# Patient Record
Sex: Female | Born: 1965 | Race: White | Hispanic: No | Marital: Single | Smoking: Current every day smoker
Health system: Southern US, Community
[De-identification: ages and names within clinical notes are randomized; demographics above are authoritative.]

## PROBLEM LIST (undated history)

## (undated) DIAGNOSIS — R51 Headache: Secondary | ICD-10-CM

## (undated) DIAGNOSIS — G809 Cerebral palsy, unspecified: Secondary | ICD-10-CM

## (undated) DIAGNOSIS — K219 Gastro-esophageal reflux disease without esophagitis: Secondary | ICD-10-CM

## (undated) DIAGNOSIS — Z8489 Family history of other specified conditions: Secondary | ICD-10-CM

## (undated) DIAGNOSIS — G709 Myoneural disorder, unspecified: Secondary | ICD-10-CM

## (undated) DIAGNOSIS — R0600 Dyspnea, unspecified: Secondary | ICD-10-CM

## (undated) DIAGNOSIS — J302 Other seasonal allergic rhinitis: Secondary | ICD-10-CM

## (undated) DIAGNOSIS — M1990 Unspecified osteoarthritis, unspecified site: Secondary | ICD-10-CM

## (undated) DIAGNOSIS — F40240 Claustrophobia: Secondary | ICD-10-CM

## (undated) HISTORY — PX: BACK SURGERY: SHX140

## (undated) HISTORY — PX: KNEE ARTHROSCOPY: SHX127

## (undated) HISTORY — PX: TUBAL LIGATION: SHX77

## (undated) HISTORY — PX: HAND SURGERY: SHX662

## (undated) HISTORY — PX: LEG SURGERY: SHX1003

## (undated) HISTORY — PX: DIAGNOSTIC LAPAROSCOPY: SUR761

---

## 1998-07-27 ENCOUNTER — Emergency Department (HOSPITAL_COMMUNITY): Admission: EM | Admit: 1998-07-27 | Discharge: 1998-07-27 | Payer: Self-pay | Admitting: Emergency Medicine

## 1998-07-27 ENCOUNTER — Encounter: Payer: Self-pay | Admitting: Emergency Medicine

## 2000-02-11 ENCOUNTER — Other Ambulatory Visit: Admission: RE | Admit: 2000-02-11 | Discharge: 2000-02-11 | Payer: Self-pay | Admitting: *Deleted

## 2000-02-11 ENCOUNTER — Other Ambulatory Visit: Admission: RE | Admit: 2000-02-11 | Discharge: 2000-02-11 | Payer: Self-pay | Admitting: Obstetrics and Gynecology

## 2000-02-24 ENCOUNTER — Encounter: Admission: RE | Admit: 2000-02-24 | Discharge: 2000-02-24 | Payer: Self-pay | Admitting: Obstetrics and Gynecology

## 2000-02-24 ENCOUNTER — Encounter: Payer: Self-pay | Admitting: Obstetrics and Gynecology

## 2000-03-09 ENCOUNTER — Ambulatory Visit (HOSPITAL_COMMUNITY): Admission: RE | Admit: 2000-03-09 | Discharge: 2000-03-09 | Payer: Self-pay | Admitting: Obstetrics and Gynecology

## 2000-03-09 ENCOUNTER — Encounter (INDEPENDENT_AMBULATORY_CARE_PROVIDER_SITE_OTHER): Payer: Self-pay

## 2000-07-12 ENCOUNTER — Encounter: Payer: Self-pay | Admitting: Emergency Medicine

## 2000-07-12 ENCOUNTER — Emergency Department (HOSPITAL_COMMUNITY): Admission: EM | Admit: 2000-07-12 | Discharge: 2000-07-12 | Payer: Self-pay | Admitting: Emergency Medicine

## 2001-02-12 ENCOUNTER — Inpatient Hospital Stay (HOSPITAL_COMMUNITY): Admission: EM | Admit: 2001-02-12 | Discharge: 2001-02-15 | Payer: Self-pay

## 2001-02-23 ENCOUNTER — Encounter: Admission: RE | Admit: 2001-02-23 | Discharge: 2001-05-24 | Payer: Self-pay | Admitting: Orthopedic Surgery

## 2007-05-02 ENCOUNTER — Inpatient Hospital Stay (HOSPITAL_COMMUNITY): Admission: RE | Admit: 2007-05-02 | Discharge: 2007-05-08 | Payer: Self-pay | Admitting: Specialist

## 2007-05-02 IMAGING — RF DG LUMBAR SPINE 2-3V
1 series · 4 of 4 positions shown · non-contrast
Comparison: none

CLINICAL DATA: L1-2 HNP. Chronic pain. Posterior fusion. 
 LUMBAR SPINE - 2 VIEW:

[Series 0: run · 4 of 4 slices shown]
[im 1/4]
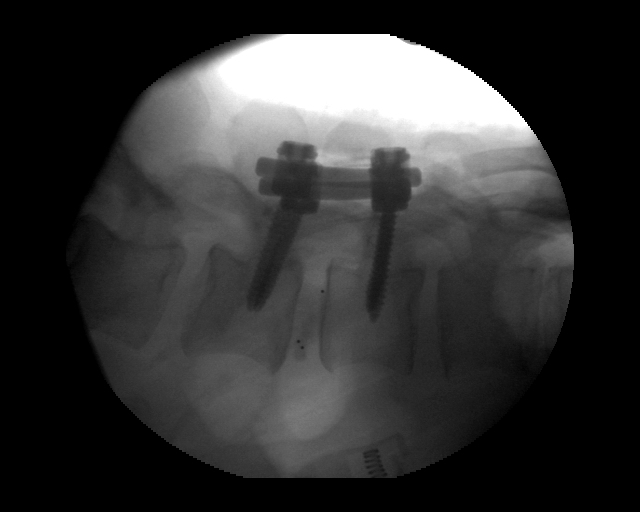
[im 2/4]
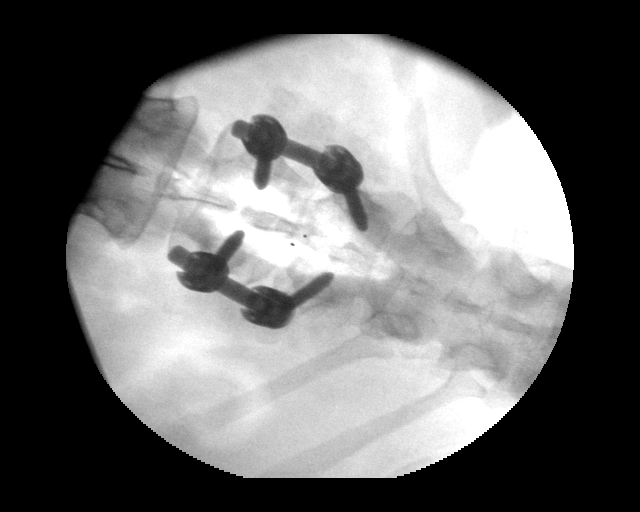
[im 3/4]
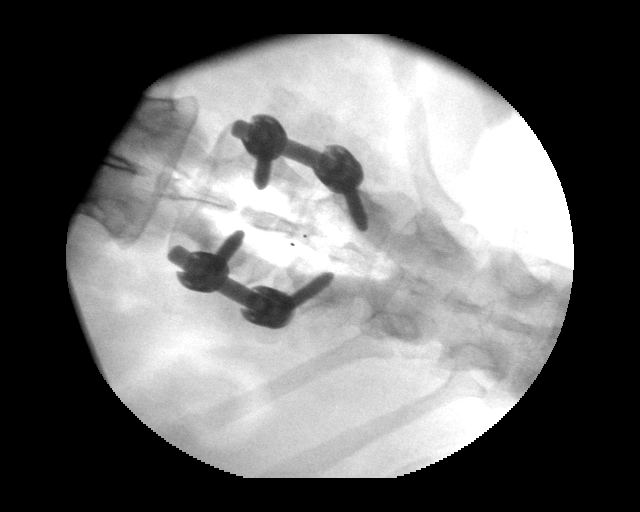
[im 4/4]
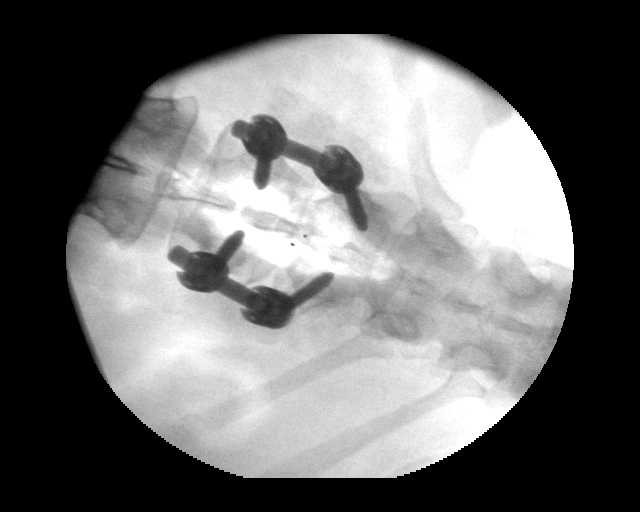

[4 of 4 positions shown; findings below may reference images not displayed]

FINDINGS: Intraoperative spot films of the thoracolumbar region are submitted postoperatively for interpretation. 
 Posterior rod and bi-pedicular screw fixation and interbody fusion material is identified at what appears to be L1-2.  Correlate clinically and with prior studies, which are not available at this time.
IMPRESSION: Posterior fusion as described ? levels appear L1-2.

## 2007-05-03 ENCOUNTER — Ambulatory Visit: Payer: Self-pay | Admitting: Physical Medicine & Rehabilitation

## 2010-12-22 NOTE — Op Note (Signed)
Tammy Li, Tammy Li               ACCOUNT NO.:  [id number]   MEDICAL RECORD NO.:  [id number]          PATIENT TYPE:  INP   LOCATION:  2871                         FACILITY:  [facility]   PHYSICIAN:  [name], M.D.   DATE OF BIRTH:  [birth year]   DATE OF PROCEDURE:  05/02/2007  DATE OF DISCHARGE:                               OPERATIVE REPORT   PREOPERATIVE DIAGNOSES:  1. Herniated nucleus pulposus, central and right-sided L1-2.  2. Long history of degenerative disk disease, L1-L2.  3. Patient with cerebral palsy and ability to ambulate independently.   POSTOPERATIVE DIAGNOSES:  1. Herniated nucleus pulposus, central and right-sided L1-2.  2. Long history of degenerative disk disease, L1-L2.  3. Patient with cerebral palsy and ability to ambulate independently.  4. Retrolisthesis, L1-2, with bilateral lateral recess stenosis as      well.   PROCEDURE:  Right microdiskectomy, L1-2, with left lateral recess  decompression, L1-2; right-sided L1-2 transforaminal lumbar interbody  fusion using a 9-mm DePuy Concorde cage with local bone graft;  posterolateral fusion, L1-2, utilizing the Vitoss and local bone graft;  left-sided L1 bone marrow aspirate; and L1-2 pedicle screw and rod  fixation using DePuy Monarch system.   SURGEON:  [name], M.D.   ASSISTANT:  Maud Deed, P.A.-C.   ANESTHESIA:  GOT, Dr. Arta Bruce, supplemented with local infiltration  with Marcaine 0.5% with 1:200,000 epinephrine, 20 mL.   FINDINGS:  Central and bilateral lateral recess stenosis, retrolisthesis  L1-2, HNP centrally, L1-2.   SPECIMENS:  None.   ESTIMATED BLOOD LOSS:  250 mL.   COMPLICATIONS:  None.   DRAINS:  Foley to straight drain, Hemovac, right upper lumbar.   DISPOSITION:  The patient returned to the PACU in good condition.   HISTORY OF PRESENT ILLNESS:  45 year old female with greater than 2-year  history of low back pain with radiation into her upper lumbar and upper  buttocks.  She has been treated conservatively with MRI suggesting  degenerative disk changes at L1-2.  Over the past 6-8 months, worsening  discomfort with increasing weakness into her proximal lower extremities.  The patient underwent repeat MRI which demonstrated disk protrusion, L1-  2, with moderate compression of the thecal sac, likely causing bilateral  L2 nerve root compression consistent with her pattern of weakness of  proximal thigh, hip flexor weakness.  She has a history of cerebral  palsy, scissors gait, and has noticed a gradual decline in ability to  ambulate due to weakness in her legs.  She underwent attempts at ESIs  that were only temporizing in terms of their relief of pain.  She is on  chronic narcotic medicines to relieve her discomfort, showing  progressive decline in function.  She is brought to the operating room  to undergo excision of HNP with decompression at L1-2 level and TLIF at  the same level, right side, as her pain is predominantly right thigh and  buttock pain.   INTRAOPERATIVE FINDINGS:  As above.   DESCRIPTION OF PROCEDURE:  After adequate general anesthesia, the  patient had a Foley catheter placed and  standard preoperative  antibiotics.  She was rolled to a prone position using the Towner  spinal table.  She had Foley catheter placed prior to rolling to the  prone position.  Standard prep with DuraPrep solution from the mid-  dorsal spine to the lower lumbar spine using DuraPrep, draped in the  usual manner.  Iodine Vi-Drape was used.   Two spinal needles placed at the expected upper lumbar segment and  intraoperative C-arm fluoroscopy used to ascertain the position of these  needles just below the L2 spinous process noted on lateral view of the  lowest needle.  Incision was made in the midline extending from this  level superiorly approximately 3-1/2 to 4 inches in length through the  skin and subcutaneous layers down to the lumbodorsal  fascia.  Bleeders  controlled using electrocautery.  Incision made on both sides of the  spinous processes of the expected L3, L2, L1 and T12.  A Cobb was used  to elevate her lumbar apparent lower dorsal muscles to the sides.  Clamps then placed on the expected spinous process of L1 and L2.  Intraoperative lateral C-arm demonstrating these at the expected L2 and  L1 levels as counted up from the L5 segment superiorly.  These areas  were then marked using electrocautery and then a marking pen.  A Cobb  was then used to carefully lift the paralumbar and paradorsal muscles  laterally, preserving the capsule at the L1, T12 level and removing the  capsule at the L1-L2 segment bilaterally.  Then exposing out further  lateral to the tips of the transverse process of L2 and at L1 at the L1-  2 level as well as at the T12-L1 level.  Bleeders controlled using  bipolar and monopolar electrocautery.  Gutters here then packed with  sponges.  A microretractor then placed after removal of the sponges.  Again then repacking lateral recesses and lateral posterolateral region.  Osteotome then used to resect the inferior articular process on the  right side of L1 and then remove bone from the inferior portion of the  lamina of L1, right side, superiorly, removing inferior portions of the  pars on the right side at L1 as well up to the insertion of the ligament  of flavum on the ventral aspect of the L1 lamina.  A 3-mm Kerrison then  used to resect the ligament of flavum.  Loupe magnification and headlamp  was used during this portion of procedure, resecting the ligament of  flavum down off of the superior aspect of the lamina of L2 and off the  medial aspect of the facet at the L1-2 level on the right side,  performing medial facetectomy of the L2 superior articular process and  then resecting the superior articular process overlying the neural  foramen on the right at the L1-2 level.  Small bleeders  controlled using  bipolar electrocautery.  The L1-2 disk identified on the right side and  carefully exposed using a Penfield 4.  Small epidural veins cauterized  using bipolar electrocautery here as well as within the inferior aspect  of the neural foramen, carefully freeing up the L1 nerve root and then  the 1-2 foramen and lifting it superiorly here.  Gelfoam placed and then  a cottonoid.  The [place] used for light retraction of the thecal sac.  A  15 blade scalpel used to incise the disk.  Pituitary rongeurs used to  then excise disk material here at the L1/2 level.  Using straight  upbiting pituitaries, disk appeared to be primarily degenerated, and the  L1 vertebra appeared to retrolisthesed on the L2 level.  This likely  represented a  pseudo disk protrusion, as healing over the posterior  aspect of disk has primarily hard disk and spur present.  This was  palpated using a hockey stick neural probe and Penfield 4.  Foraminotomy  was performed over the L2 nerve root, and as it exited out its neural  foramen it appeared to be quite well-freed.  On the left side similarly,  the inferior articular process of L1 was carefully resected over about  50% of its height from cranial caudal and then carried medial, resecting  a small portion of the inferior lamina of L1 up to the insertion of  ligament of flavum here.  The ligament of flavum was then lifted using  Cushings and then resected off the inferior aspect of the L1 lamina and  then the medial aspect of the L1-2 facet.  Foraminotomy performed down  the L2 nerve root using 2 and 3-mm Kerrisons.  Then the lateral recess  decompressed on this side, resecting ligament of flavum off the medial  aspect of the superior articular process of L2 and decompressing the  lateral recess out to the medial border of the L2 pedicle.  Hockey stick  neural probe could then easily be passed out the L2 neural foramen as  well as the L1 neural foramen, with  retraction of the thecal sac and  palpation of the disk here demonstrating primarily hard disk, again felt  to be secondary to retrolisthesis of L1-L2.  There was no soft disk  herniation material noted.  Gelfoam thrombin-soaked then placed within  the lateral recess as well as cottonoids to control any small epidural  vein bleeding here.   Attention then turned to placement of the pedicle screws on the left  side where a pedicle finder was applied to the intersection of the  transverse process of L1 with the lateral aspect of pedicle of L1 just  at the superior articular process of L1 laterally.  The awl then placed.  Intraoperative C-arm radiograph demonstrated all properly aligned to the  center portion of the expected pedicle on lateral view.  An opening into  the lateral aspect of the pedicle was made, and then the handheld  pedicle finder then used to probe the pedicle down to about 35-40 mm.  Depth of 40 mm was chosen with a 5.5 screw, and tapping was performed  using a 4.75 tap.  Note that prior to tapping, a blunt-tip trocar was  used to place aspiration equipment for the Vitoss, this amounted to a  [employer]oshiba-like needle being inserted within the pedicle and within the  vertebral body of L1.  Aspiration of 10 mL of bone marrow was carried  out without difficulty.  The aspiration device was then removed.  Tapping performed using a 4.75 tap.  Decortication carried out of the  transverse process of L1 on the left side, and then the pedicle screw  measuring 5.5 x 40 mm was then inserted at a proper degree of  orientation of convergence and of lordosis here.  This fixed the pedicle  quite nicely and obtained excellent purchase.  The fastener for the  screw was then loosened using the fastener looser.   Then attention was turned to the L2 level where similarly the awl was  used to perform an opening into the lateral aspect of the pedicle  at the  L2 pedicle intersection of the  transverse process of the L1-2 facet  laterally.  Proper degree of convergence.  C-arm fluoroscopy used to  ascertain correct position and alignment of the awl at this insertion  point, and then a pedicle probe was used probe the pedicle channel at  the correct degree of convergence.  No difficulty was obtained doing  this.  Note that a ball-tipped probe was used between each portion of  the insertion of the pedicle screws and was used after use of the  pedicle finder and was used after tapping procedure as well prior to  placement of the screw.  Note that after using the awl, then the pedicle  finder was used probe the pedicle at the L2 level on the left side and  ball-tipped probe then again used to check the channel and ensure the  patency of the opening placed within the pedicle to ensure no broach in  cortex.  Again, a 5.5 x 40-mm screw was chosen, tapping with a 4.75,  decorticating the transverse process of L2, placing Vitoss then from L1  to L2 posterolaterally here and placing the pedicle screws then at the  L2 level on the left side.  Intraoperative C-arm demonstrated the screws  at L1 and L2 to be an excellent position and alignment.   Attention then turned to the right side, returning to the right side  then again using loupe medication and headlamp, an awl was used to make  an initial opening in the L1 pedicle on the right side at the T12-L1  facet level at the intersection of the transverse process of L1 with the  lateral aspect of pedicle of L1 observed on C-arm fluoroscopy to be in  the correct degree of orientation with the pedicle.  I was in the  central portion, so the handheld pedicle finder then used to probe the  pedicle.  Ball-tipped probe used to ensure patency of the pedicle  opening with no evidence of broaching of cortex.  Tapping then performed  using a 4.75 tap, again checking the entry point and the opening within  the pedicle to ensure no broaching of cortex  again using ball-tipped  probe the entire depth.  Decortication carried out of the transverse  process of L1.  Vitoss was then applied and then after tapping with a  4.75 tap, the 5.5 screw was placed.  This was measured at 40 mm x 5.5.  It was placed without difficulty.   Attention then turned to L2 on the right side.  Awl used to make an  entry point using C-arm fluoroscopy to ascertain the correct position  and alignment and then using handheld pedicle finder, again probing the  pedicle channel and then using ball-tipped probe to check the channel to  ensure patency and no evidence of broach of cortex.  Depth of 40 mm  obtained.  A 4.75 tap used to tap the opening into the pedicle itself.  The ball-tipped probe again used to check the channel, and then a 40-mm  x 5.5 screw then placed in the proper degree of convergence and  orientation.  Decortication was carried out of the transverse process of  L2 on the right side without difficulty.  Vitoss was placed from the  transverse process of L1 to L2 on the right side.  Note that each of the  pedicle screw fasteners were then loosened using the correct looser to  allow for engagement of the  rod within the fastener.  With this then,  the patient still had muscle relaxer on board so that instead of  checking the pedicle resistance at this point, continuation of the case  was carried out with TLIF performed on the right side.  Note that the  thecal sac at the L1-2 level was retracted medially a few millimeters  and the L1 nerve retracted superiorly.  An osteotome was used to resect  the superior articular process of L2 on the right side flush with the  pedicle superiorly.  Then this was used to also make an opening into the  disk space through the previous annulotomy performed with diskectomy on  this right side.  Bleeders controlled using bipolar electrocautery.  Pituitary rongeurs used to debride the disk space.  A laminar spreader  was  then placed from the lamina of the residual portion of L1 on the  right side to that of the L2 lamina and distraction of the disk space  obtained.  Disk space further debrided using pituitary rongeurs,  upbiting and straight.  Curettage was then carried out of the  cartilaginous endplates using straight curettes as well as upbiting right, upbiting left curettes and then the ring transfer used, straight  and upbiting, both of this level.  Curettaging the endplates down to  bleeding bony endplates at the inferior aspect L1 and superior aspect  L2.  Sounding was then performed of the disk space using an 8-mm and  then a 9-mm sound, 9  mm providing excellent fit.  Note that the  dilatation was carried out of the disk space using 8 and 9 mm dilators  initially.  Following this then, irrigation was carried out.  The  permanent cage chosen was a 9-mm cage, and note that this was then  packed with local bone graft that had been harvested from removal of the  facet on the right side at the L1-2 level and partial facetectomy on the  left side.  This was passed through the bone mill.  The bone graft not  used within the Concorde cage, 9-mm, was then placed into the disk space  on the right side.  Then impacted into place using an 8-mm trial.  This  completed then, the cage was then inserted into place and packed into  place using a laminar spreader for distraction of disk space with its  insertion.  With this completed then, intraoperative C-arm demonstrated  the cage with an excellent position and alignment of the central  portions of the disk.  Testing was then carried out.  Soft tissue  resistance for the screws, left side L1 greater than 50, the left L2  greater than 40, right L1 greater than 50, and right L2 greater than 50.  These represented excellent soft tissue resistant measurements  indicating screw placement to be adequate.   Following this, irrigation was carried out.  Small bleeders  controlled  using bipolar electrocautery.  Careful inspection of the spinal canal  demonstrated the L1-L2 nerve roots exiting without impingement.  There  was no bony debris found within the spinal canal on the right side or  the posterior aspect of the laminotomy region.  40-mm rods were chosen.  These were placed within the screw fasteners on both sides and the caps  then applied.  The caps were then tightened to the fasteners, connecting  rods the fasteners at 80 foot pounds at the L1 level.  Compression  obtained then between the right-sided L1  and L2 fastener, and the  fastener at L2 was then tightened to 80 foot pounds.  Similarly, this  was performed on the left side and compression obtained between the  fasteners, and the cap and on the left side at L2 was then fastened at  80 foot pounds.  This completed internal fixation, and permanent C-arm  images obtained in the AP and lateral planes documenting the fixation of  the L1-2 level on C-arm fluoroscopy views.  Also, it demonstrated the  place of the cage in excellent position and alignment at the L1-2 level.  This completed, the soft tissues were carefully debrided of any  devascularized muscle tissue on either side of the midline.  This was  done using curved Mayo scissors.  Bleeders controlled using  electrocautery.  A medium Hemovac drain placed in the depths of the  incision on the right side and the lumbodorsal fascia then approximated  in the midline with interrupted #1 Vicryl sutures in simple fashion.  Deep subcutaneous layers approximated with interrupted #1 and 0 Vicryl  suture, the more superficial layers with interrupted 2-0 Vicryl sutures,  and then the skin closed with a running subcutaneous stitch of 4-0  Vicryl.  The patient then had Dermabond applied, 4 x 4s affixed to the  skin with Hypafix tape.   The patient was then returned to a supine position, reactivated,  extubated, and returned to the recovery room in  satisfactory condition.  All instrument and sponge counts were correct.  Note that intraoperative  studies, including neuro monitoring, demonstrated no abnormalities noted  throughout the case.  Cell Saver blood return:  None.      [name], M.D.  Electronically Signed     JEN/MEDQ  D:  05/02/2007  T:  05/02/2007  Job:  [id number]

## 2010-12-25 NOTE — Discharge Summary (Signed)
Tammy Li, Tammy Li               ACCOUNT NO.:  [id number]   MEDICAL RECORD NO.:  [id number]          PATIENT TYPE:  INP   LOCATION:  5012                         FACILITY:  [facility]   PHYSICIAN:  [name], M.D.   DATE OF BIRTH:  1965-09-30   DATE OF ADMISSION:  05/02/2007  DATE OF DISCHARGE:  05/08/2007                               DISCHARGE SUMMARY   ADMISSION DIAGNOSES:  1. Herniated nucleus pulposus central and right, L1-2.  2. Long history of degenerative disk disease, L1-2.  3. Cerebral palsy with ability to ambulate independently.  4. Anxiety and claustrophobia.  5. Gastroesophageal reflux disease.  6. Chronic constipation.   DISCHARGE DIAGNOSES:  1. Herniated nucleus pulposus central and right, L1-2.  2. Long history of degenerative disk disease, L1-2.  3. Cerebral palsy with ability to ambulate independently.  4. Retrolisthesis, L1-2, with bilateral lateral recess stenosis.  5. Posthemorrhagic anemia requiring blood transfusion.  6. Anxiety and claustrophobia.  7. Gastroesophageal reflux disease.  8. Chronic constipation.   PROCEDURE:  On May 02, 2007, the patient underwent right  microdiskectomy, L1-2, with left lateral recess decompression, L1-2,  right-sided L1-2 transforaminal lumbar interbody fusion with local bone  graft and cage.  Posterolateral fusion, L1-2, with Vitoss and local bone  graft with rod and pedicle screw fixation.  This was performed by Dr.  [name], assisted by Maud Deed, PA-C, under general anesthesia.   CONSULTATIONS:  [place] [facility], Ranelle Oyster, M.D.   BRIEF HISTORY:  The patient is a 45 year old female with a 2+ years'  history of low back pain and radiation into the upper lumbar and upper  buttock area.  MRI has suggested degenerative disk changes at L1-2.  Over the last few months she has had worsening pain and weakness in the  lower extremities.  Repeat MRI demonstrated disk protrusion at L1-2 with  moderate compression of the thecal sac, likely causing bilateral L2  nerve root compression consistent with her pattern of weakness of  proximal thighs as well as hip flexor weakness.  She does have known  history of cerebral palsy with scissor gait and has noted a gradual  decline in ability to ambulate due to the weakness in her legs.  Epidural steroid injections have temporized her relief, but she is now  on chronic pain medications to relieve her discomfort.  It was felt she  would benefit from the procedure as stated above and was admitted for  this.   BRIEF HOSPITAL COURSE:  Postoperatively neurovascular motor function of  the lower extremities was noted to be intact.  The patient was started  on ambulation and gait training by physical therapy utilizing a brace  and walker for ambulation.  She was able to advance slowly.  Initially  it was felt she may need inpatient rehabilitation.  However, she was  able to advance fairly well during the hospital stay and was able to be  discharged home instead.  She was able to walk as much as 150 feet prior  to discharge.  Occupational therapy assisted with ADLs.  She was able to  demonstrate donning and doffing of her Aspen LSO without difficulty.  The patient had mild postop ileus treated with a Dulcolax suppository.  Diet was held until her bowel function returned.  Once bowel function  returned she was progressed to a regular diet.  The patient became  symptomatic with postoperative anemia with hemoglobin and hematocrit 8.2  and 24 and was transfused with 2 units of autogenous blood.  After  transfusion hemoglobin to 10.0.  Hemovac drain was discontinued on the  first postoperative day and her wound was healing well throughout the  remainder of the stay.  At the time of discharge she was afebrile, vital  signs stable.  She was voiding without difficulty, having normal bowel  movements and was taking a regular diet.  She was discharged to  home in  stable condition.   PERTINENT LABORATORY VALUES:  Hemoglobin and hematocrit as stated above.  Chemistry studies on admission within normal limits.  Urinalysis on  admission:  Trace leukocyte esterase, 0-2 wbc's, 3-6 rbc's.   PLAN:  The patient was discharged home.  Arrangements made for home  health physical therapy, occupational therapy, bath aides.  She will  wear her brace at all times when ambulating and will use a walker for  ambulation.  She may shower.  Dressing change as needed.  Follow up with  Dr. [name] 2 weeks from surgery.   MEDICATIONS:  1. OxyContin 20 mg p.o. q.12h.  2. Percocet one to two every 4-6 hours as needed for pain.  3. Robaxin 500 mg one every 8 hours as needed for spasm.  4. Phenergan 25 mg one every 8 hours as needed for nausea.  5. Medrol Dosepak to be completed at home.   All questions encouraged and answered.   CONDITION ON DISCHARGE:  Stable.      Wende Neighbors, P.A.      [name], M.D.  Electronically Signed    SMV/MEDQ  D:  08/16/2007  T:  08/17/2007  Job:  [id number]

## 2010-12-25 NOTE — Op Note (Signed)
[facility]  Patient:    Tammy Li, Tammy Li                      MRN: [id number] Proc. Date: 03/09/00 Adm. Date:  [id number] Disc. Date: [id number] Attending:  Silverio Lay A                           Operative Report  PREOPERATIVE DIAGNOSIS:       Chronic pelvic pain.  POSTOPERATIVE DIAGNOSES:      1. Chronic pelvic pain.                               2. 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  OPERATION:                    Diagnostic laparoscopy with peritoneal biopsy                               x 2.  SURGEON:                      Silverio Lay, M.D.  ASSISTANT:  ANESTHESIA:                   General anesthesia.  ESTIMATED BLOOD LOSS:         Minimal.  DESCRIPTION OF PROCEDURE:     After being informed of the planned procedure and the possible complications including bleeding, infection, trauma to bowels, bladder, blood vessels and the possible need for laparotomy; informed consent was obtained and signed and the patient was taken to OR #3 and given general anesthesia with endotracheal intubation.  She was placed in the lithotomy position, prepped and draped in a sterile fashion and her bladder was emptied with a Foley catheter.  Gynecologic examination revealed an anteverted uterus, normal in size and shape, with two normal adnexal areas.  A speculum was inserted, anterior lip of the cervix was grasped with a tenaculum forceps and an acorn manipulator was inserted.  Speculum was removed.  A 10 mm incision was made in the umbilical area for insertion of a Verres needle and insufflation of a pneumoperitoneum with CO2 at a maximum pressure of 15 mmHg. That needle was removed for insertion of a 10 mm trocar with insertion of a laparoscope mounted on video monitor.  OBSERVATION:  Anterior cul-de-sac is normal.  The uterus is normal in size and shape. Both tubes revealed status post  bilateral tubal ligation.  Both ovaries are normal but the right one bearing a 1 cm follicular cyst.  There are no adhesions in the pelvis.  Posterior cul-de-sac is remarkable for a perineal defect, midline, which creates a window about 3 cm x 2 cm deep and within that window after removal of a small amount of free fluid, we could see two small perineal nodules, whitish in color and possible compatible with endometriosis. Those two small nodules will be biopsied after insertion of a 5 mm trocar in the suprapubic area under direct visualization.  Both biopsy sites are then cauterized with bipolar cauterization.  The remainder of the  peritoneal cavity is completely normal.  Abdominal investigation is normal with a normal-appearing appendix.  Both trocars are then removed after evacuating the pneumoperitoneum.  Both incisions are closed with subcutaneous stitches of 4-0 Vicryl.  Instrument and sponge count is complete x 2.  The procedure is well tolerated by the patient who is taken to the recovery room in a well and stable condition. DD:  03/09/00 TD:  03/11/00 Job: [id number]

## 2011-05-20 LAB — HEMOGLOBIN AND HEMATOCRIT, BLOOD: Hemoglobin: 9.9 — ABNORMAL LOW

## 2011-05-20 LAB — BASIC METABOLIC PANEL
CO2: 28
Calcium: 8.5
Creatinine, Ser: 0.76
GFR calc Af Amer: >60
GFR calc non Af Amer: >60
Glucose, Bld: 133 — ABNORMAL HIGH

## 2011-05-20 LAB — CBC
HCT: 34.2 — ABNORMAL LOW
Hemoglobin: 11.5 — ABNORMAL LOW
MCHC: 33.6
MCV: 84
Platelets: 330
RBC: 4.07
RDW: 13.5
WBC: 8.7

## 2011-05-20 LAB — URINE MICROSCOPIC-ADD ON

## 2011-05-20 LAB — URINALYSIS, ROUTINE W REFLEX MICROSCOPIC
Glucose, UA: NEGATIVE
Specific Gravity, Urine: 1.017

## 2011-05-20 LAB — TYPE AND SCREEN: Antibody Screen: NEGATIVE

## 2011-05-20 LAB — ABO/RH: ABO/RH(D): A POS

## 2012-10-23 ENCOUNTER — Other Ambulatory Visit: Payer: Self-pay | Admitting: Specialist

## 2012-10-29 ENCOUNTER — Other Ambulatory Visit: Payer: Self-pay

## 2012-11-27 ENCOUNTER — Other Ambulatory Visit (HOSPITAL_COMMUNITY): Payer: Self-pay | Admitting: Specialist

## 2012-11-29 ENCOUNTER — Encounter (HOSPITAL_COMMUNITY): Payer: Self-pay

## 2012-12-05 NOTE — Pre-Procedure Instructions (Signed)
Tammy Li  12/05/2012   Your procedure is scheduled on: Friday, May 2nd   Report to [facility] at  10:30 AM.             [name] will take the [facility] to the 3rd Floor)   Call this number if you have problems the morning of surgery: [phone]   Remember:   Do not eat food or drink liquids after midnight Thursday.   Take these medicines the morning of surgery with A SIP OF WATER: Norco, Valium   Do not wear jewelry, make-up or nail polish.  Do not wear lotions, powders, or perfumes. You may NOT wear deodorant.  Do not shave underarms & legs 48 hours prior to surgery.   Do not bring valuables to the hospital.   Contacts, dentures or bridgework may not be worn into surgery.  Leave suitcase in the car. After surgery it may be brought to your room.  For patients admitted to the hospital, checkout time is 11:00 AM the day of discharge.   Name and phone number of your driver:    Special Instructions: Shower using CHG 2 nights before surgery and the night before surgery.  If you shower the day of surgery use CHG.  Use special wash - you have one bottle of CHG for all showers.  You should use approximately 1/3 of the bottle for each shower.   Please read over the following fact sheets that you were given: Pain Booklet, Coughing and Deep Breathing, MRSA Information and Surgical Site Infection Prevention

## 2012-12-06 ENCOUNTER — Encounter (HOSPITAL_COMMUNITY): Payer: Self-pay

## 2012-12-06 ENCOUNTER — Encounter (HOSPITAL_COMMUNITY)
Admission: RE | Admit: 2012-12-06 | Discharge: 2012-12-06 | Disposition: A | Discharge status: Home / Self Care | Payer: Medicaid Other | Source: Ambulatory Visit | Attending: Specialist | Admitting: Specialist

## 2012-12-06 HISTORY — DX: Headache: R51

## 2012-12-06 HISTORY — DX: Claustrophobia: F40.240

## 2012-12-06 HISTORY — DX: Myoneural disorder, unspecified: G70.9

## 2012-12-06 HISTORY — DX: Unspecified osteoarthritis, unspecified site: M19.90

## 2012-12-06 HISTORY — DX: Gastro-esophageal reflux disease without esophagitis: K21.9

## 2012-12-06 HISTORY — DX: Cerebral palsy, unspecified: G80.9

## 2012-12-06 LAB — COMPREHENSIVE METABOLIC PANEL
ALT: 10 U/L (ref 0–35)
Calcium: 9.9 mg/dL (ref 8.4–10.5)
Creatinine, Ser: 0.65 mg/dL (ref 0.50–1.10)
GFR calc Af Amer: >90 mL/min (ref >90)
GFR calc non Af Amer: >90 mL/min (ref >90)
Glucose, Bld: 88 mg/dL (ref 70–99)
Sodium: 137 mEq/L (ref 135–145)
Total Protein: 7.8 g/dL (ref 6.0–8.3)

## 2012-12-06 LAB — CBC
MCH: 30.3 pg (ref 26.0–34.0)
MCHC: 36 g/dL (ref 30.0–36.0)
MCV: 84.1 fL (ref 78.0–100.0)
Platelets: 277 10*3/uL (ref 150–400)

## 2012-12-06 LAB — URINALYSIS, ROUTINE W REFLEX MICROSCOPIC
Bilirubin Urine: NEGATIVE
Nitrite: NEGATIVE
Specific Gravity, Urine: 1.022 (ref 1.005–1.030)
Urobilinogen, UA: 0.2 mg/dL (ref 0.0–1.0)
pH: 7.5 (ref 5.0–8.0)

## 2012-12-06 LAB — HCG, SERUM, QUALITATIVE: Preg, Serum: NEGATIVE

## 2012-12-06 LAB — URINE MICROSCOPIC-ADD ON

## 2012-12-06 NOTE — Pre-Procedure Instructions (Addendum)
Lossie Kalp  12/06/2012   Your procedure is scheduled on: 12/08/12  Report to [facility] at 1030 AM.  Call this number if you have problems the morning of surgery: [phone]   Remember:   Do not eat food or drink liquids after midnight.   Take these medicines the morning of surgery with A SIP OF WATER:pain med, valium, nexium   Do not wear jewelry, make-up or nail polish.  Do not wear lotions, powders, or perfumes. You may wear deodorant.  Do not shave 48 hours prior to surgery. Men may shave face and neck.  Do not bring valuables to the hospital.  Contacts, dentures or bridgework may not be worn into surgery.  Leave suitcase in the car. After surgery it may be brought to your room.  For patients admitted to the hospital, checkout time is 11:00 AM the day of  discharge.   Patients discharged the day of surgery will not be allowed to drive  home.  Name and phone number of your driver:   Special Instructions: Shower using CHG 2 nights before surgery and the night before surgery.  If you shower the day of surgery use CHG.  Use special wash - you have one bottle of CHG for all showers.  You should use approximately 1/3 of the bottle for each shower.   Please read over the following fact sheets that you were given: Pain Booklet, Coughing and Deep Breathing, MRSA Information and Surgical Site Infection Prevention

## 2012-12-07 LAB — URINE CULTURE

## 2012-12-07 MED ORDER — CEFAZOLIN SODIUM-DEXTROSE 2-3 GM-% IV SOLR
2.0000 g | INTRAVENOUS | Status: AC
  Administered 2012-12-08: 2 g via INTRAVENOUS
  Filled 2012-12-07: qty 50

## 2012-12-07 MED ORDER — CHLORHEXIDINE GLUCONATE 4 % EX LIQD: 60.0000 mL | Freq: Once | CUTANEOUS | Status: DC

## 2012-12-07 NOTE — H&P (Signed)
Tammy Li is an 47 y.o. female.   Chief Complaint: left neck and arm pain FAO:[id number] patient complains of persistent neck pain, radiation in the left neck, left shoulder, left arm and the left hand  Clinically she has decreased range of motion in the neck.  Turning to the left side and extension of the neck causes replication of severe pain in the left posterior and lateral neck with spasm, radiation in the left shoulder, into the left lateral posterior arm and into the left dorsal forearm and fingers in the left hand. The results of the MRI scan demonstrate left-sided C3-4 foraminal narrowing with a combination of disk and uncinate spurring narrowing this foramen.  No cord deformity at that level.  The 5-6 right side uncinate spur with some mild narrowing, but no cord deformity.  At C6-7 shallow left paracentral disk protrusion without cord deformity, no foraminal compromise or exiting nerve root compromise felt to be present.  She has been having discomfort for well over a year and has not improved with conservative management as yet so at this point she wishes to go ahead and consider intervention in order to get off strong narcotic medicines and try and get her back to some normalcy.  Anterior diskectomy can be done, but not necessary for the protrusions at these levels. They are at both ends of the cervical spine and surgical treatment can be done with foraminotomy alone,  decompression the individual nerve roots and improving her overall function.    Past Medical History  Diagnosis Date  . CP (cerebral palsy)   . Claustrophobia   . GERD (gastroesophageal reflux disease)   . Neuromuscular disorder     cp  . Headache   . Arthritis     Past Surgical History  Procedure Laterality Date  . Hand surgery Left     cyst  . Knee arthroscopy Right   . Leg surgery Bilateral     heel cord lengthening  rt and lf plus 2 other   . Back surgery    . Tubal ligation      History reviewed. No pertinent  family history. Social History:  reports that she has been smoking Cigarettes.  She has a 6.25 pack-year smoking history. She does not have any smokeless tobacco history on file. She reports that she does not drink alcohol or use illicit drugs.  Allergies:  Allergies  Allergen Reactions  . Sulfa Antibiotics Nausea And Vomiting    Medications Prior to Admission  Medication Sig Dispense Refill  . Cholecalciferol (VITAMIN D-3 PO) Take 2 tablets by mouth daily.      . diazepam (VALIUM) 5 MG tablet Take 5-10 mg by mouth every 8 (eight) hours as needed (muscle spasm).      Marland Kitchen esomeprazole (NEXIUM) 40 MG capsule Take 40 mg by mouth daily before breakfast.      . HYDROcodone-acetaminophen (NORCO) 10-325 MG per tablet Take 1 tablet by mouth every 6 (six) hours as needed for pain.      Marland Kitchen loratadine (CLARITIN) 10 MG tablet Take 10 mg by mouth daily.        No results found for this or any previous visit (from the past 48 hour(s)). No results found.  Review of Systems  Constitutional: Negative.   HENT: Positive for neck pain.   Eyes: Negative.   Respiratory: Negative.   Cardiovascular: Negative.   Gastrointestinal: Negative.   Genitourinary: Negative.   Skin: Negative.   Neurological: Negative.   Endo/Heme/Allergies: Negative.  Psychiatric/Behavioral: Negative.     Blood pressure 103/74, pulse 87, temperature 97.9 F (36.6 C), temperature source Oral, resp. rate 20, SpO2 98.00%. Physical Exam  Constitutional: She is oriented to person, place, and time. She appears well-developed and well-nourished.  HENT:  Head: Normocephalic and atraumatic.  Eyes: EOM are normal. Pupils are equal, round, and reactive to light.  Cardiovascular: Normal rate, regular rhythm, normal heart sounds and intact distal pulses.   Respiratory: Effort normal and breath sounds normal.  GI: Soft. Bowel sounds are normal.  Musculoskeletal:   exam shows that she has tenderness along the posterolateral aspect of her  neck, the findings above.  She does have weakness in left finger extension and left triceps is also diminished.  Volar flexion strength in the left wrist is slightly decreased at 5-/5.  The rest are 4+/5, that is to say her triceps and her finger extension strength.  Biceps, finger flexion strength, wrist dorsiflexion strength are normal.  Her reflexes biceps 2+ and symmetric, triceps 1+ left, 2+ right.  She has problems with left hemiparetic gait and problems that relate to cerebral palsy is likely resulting in a scissoring gait.    Neurological: She is alert and oriented to person, place, and time.  Skin: Skin is warm and dry.  Psychiatric: She has a normal mood and affect.     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 There has been no interval  Change in this patient's exam preop  history and physical exam  Lab tests and images have been examined and reviewed.  The Risks benefits and alternative treatments have been discussed  extensively,questions answered.  The patient has elected to undergo the discussed surgical treatment.   Imri Lor E 12/08/2012, 12:35 PM

## 2012-12-08 ENCOUNTER — Ambulatory Visit (HOSPITAL_COMMUNITY): Payer: Medicaid Other | Admitting: Certified Registered Nurse Anesthetist

## 2012-12-08 ENCOUNTER — Ambulatory Visit (HOSPITAL_COMMUNITY): Payer: Medicaid Other

## 2012-12-08 ENCOUNTER — Observation Stay (HOSPITAL_COMMUNITY)
Admission: RE | Admit: 2012-12-08 | Discharge: 2012-12-09 | Disposition: A | Discharge status: Home / Self Care | Payer: Medicaid Other | Source: Ambulatory Visit | Attending: Specialist | Admitting: Specialist

## 2012-12-08 ENCOUNTER — Encounter (HOSPITAL_COMMUNITY)
Admission: RE | Disposition: A | Discharge status: Home / Self Care | Payer: Self-pay | Source: Ambulatory Visit | Attending: Specialist

## 2012-12-08 ENCOUNTER — Encounter (HOSPITAL_COMMUNITY): Payer: Self-pay | Admitting: *Deleted

## 2012-12-08 ENCOUNTER — Encounter (HOSPITAL_COMMUNITY): Payer: Self-pay | Admitting: Certified Registered Nurse Anesthetist

## 2012-12-08 DIAGNOSIS — G809 Cerebral palsy, unspecified: Secondary | ICD-10-CM | POA: Insufficient documentation

## 2012-12-08 DIAGNOSIS — R51 Headache: Secondary | ICD-10-CM | POA: Insufficient documentation

## 2012-12-08 DIAGNOSIS — G709 Myoneural disorder, unspecified: Secondary | ICD-10-CM | POA: Insufficient documentation

## 2012-12-08 DIAGNOSIS — F40298 Other specified phobia: Secondary | ICD-10-CM | POA: Insufficient documentation

## 2012-12-08 DIAGNOSIS — M47812 Spondylosis without myelopathy or radiculopathy, cervical region: Secondary | ICD-10-CM | POA: Diagnosis present

## 2012-12-08 DIAGNOSIS — K219 Gastro-esophageal reflux disease without esophagitis: Secondary | ICD-10-CM | POA: Insufficient documentation

## 2012-12-08 DIAGNOSIS — M5020 Other cervical disc displacement, unspecified cervical region: Principal | ICD-10-CM | POA: Diagnosis present

## 2012-12-08 DIAGNOSIS — M129 Arthropathy, unspecified: Secondary | ICD-10-CM | POA: Insufficient documentation

## 2012-12-08 DIAGNOSIS — Z01812 Encounter for preprocedural laboratory examination: Secondary | ICD-10-CM | POA: Insufficient documentation

## 2012-12-08 HISTORY — PX: POSTERIOR CERVICAL FUSION/FORAMINOTOMY: SHX5038

## 2012-12-08 IMAGING — RF DG CERVICAL SPINE 2 OR 3 VIEWS
1 series · 3 of 3 positions shown · non-contrast
Comparison: MRI cervical spine dated [DATE]

CLINICAL DATA: Left C4 and C7 radiculopathy

DG C-ARM 1-60 MIN,CERVICAL SPINE - 2-3 VIEW
TECHNIQUE: C-arm fluoroscopic images were obtained
intraoperatively and submitted for postoperative interpretation.
Please see the performing provider's procedural report for the
fluoroscopy time utilized.
Fluoroscopy time:  6 seconds

[Series 1: run · 3 of 3 slices shown]
[im 1/3]
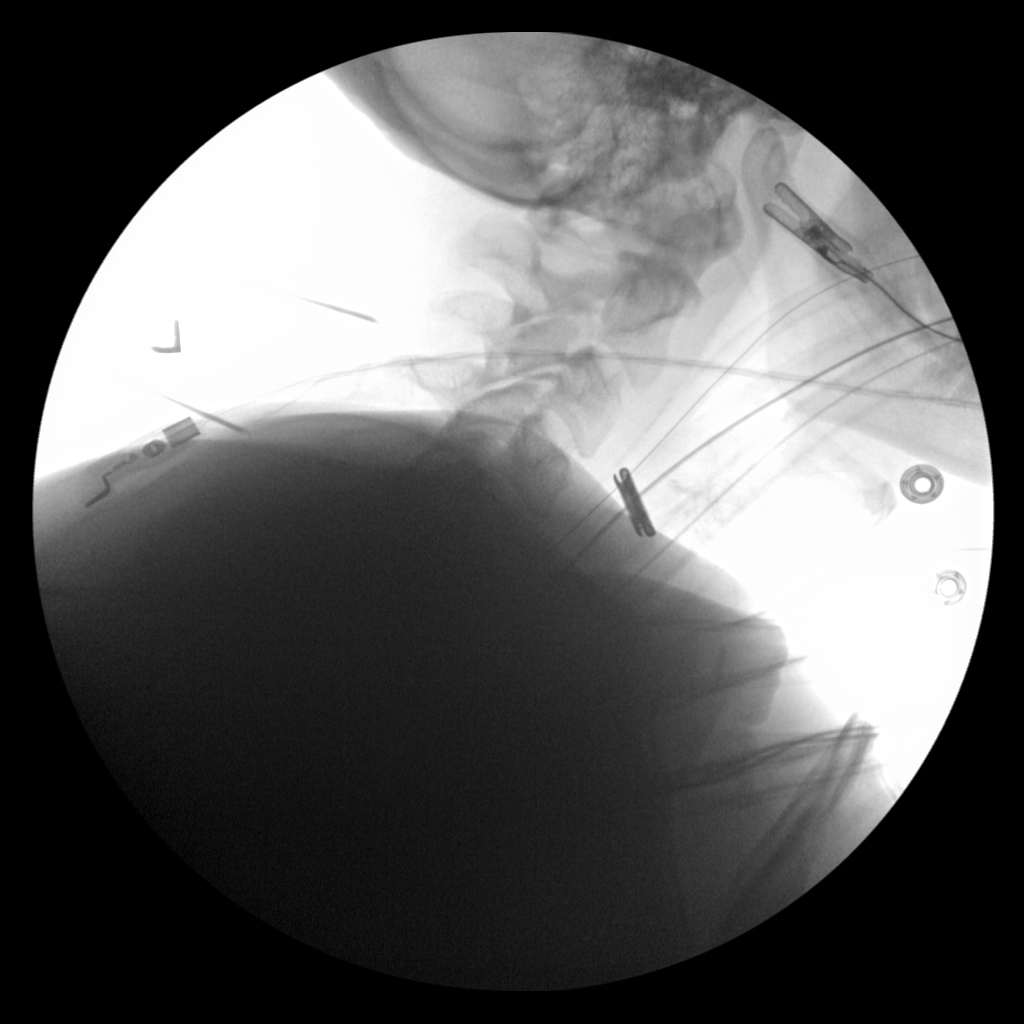
[im 2/3]
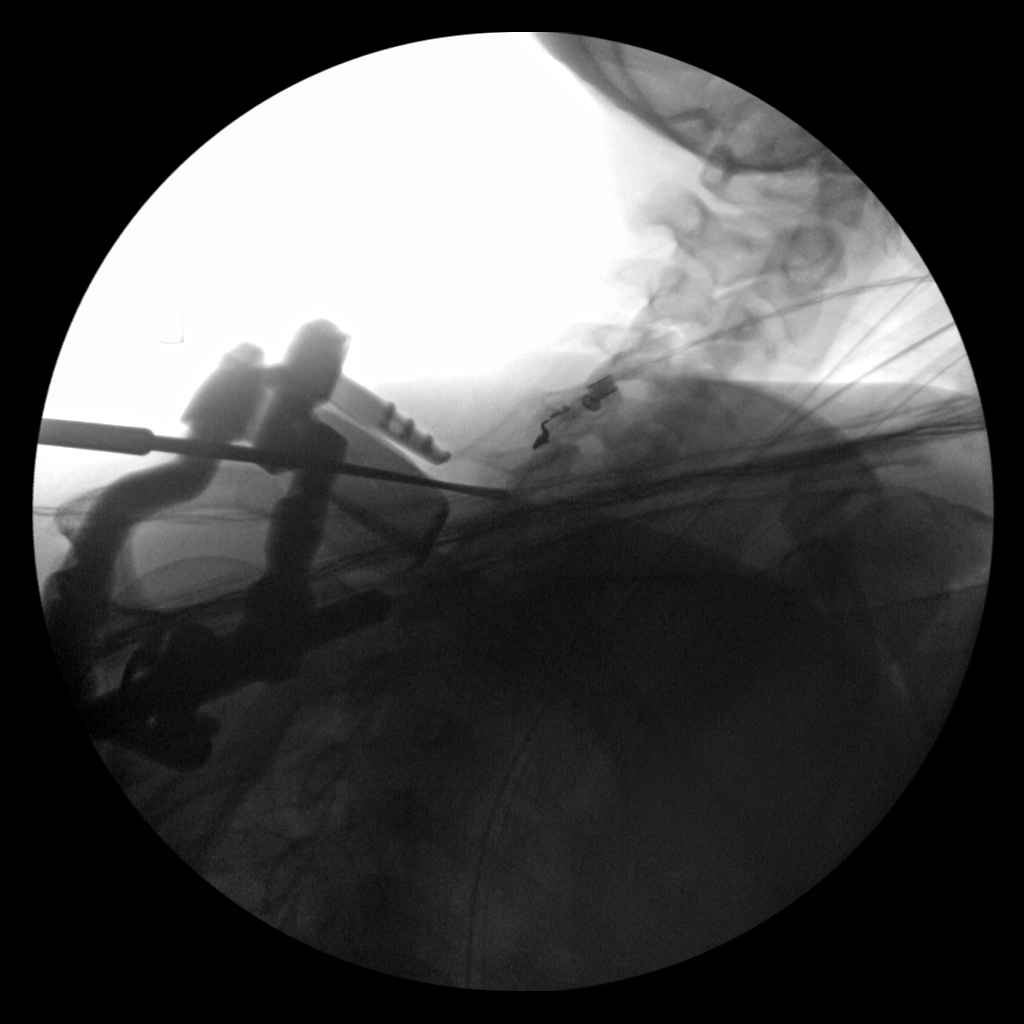
[im 3/3]
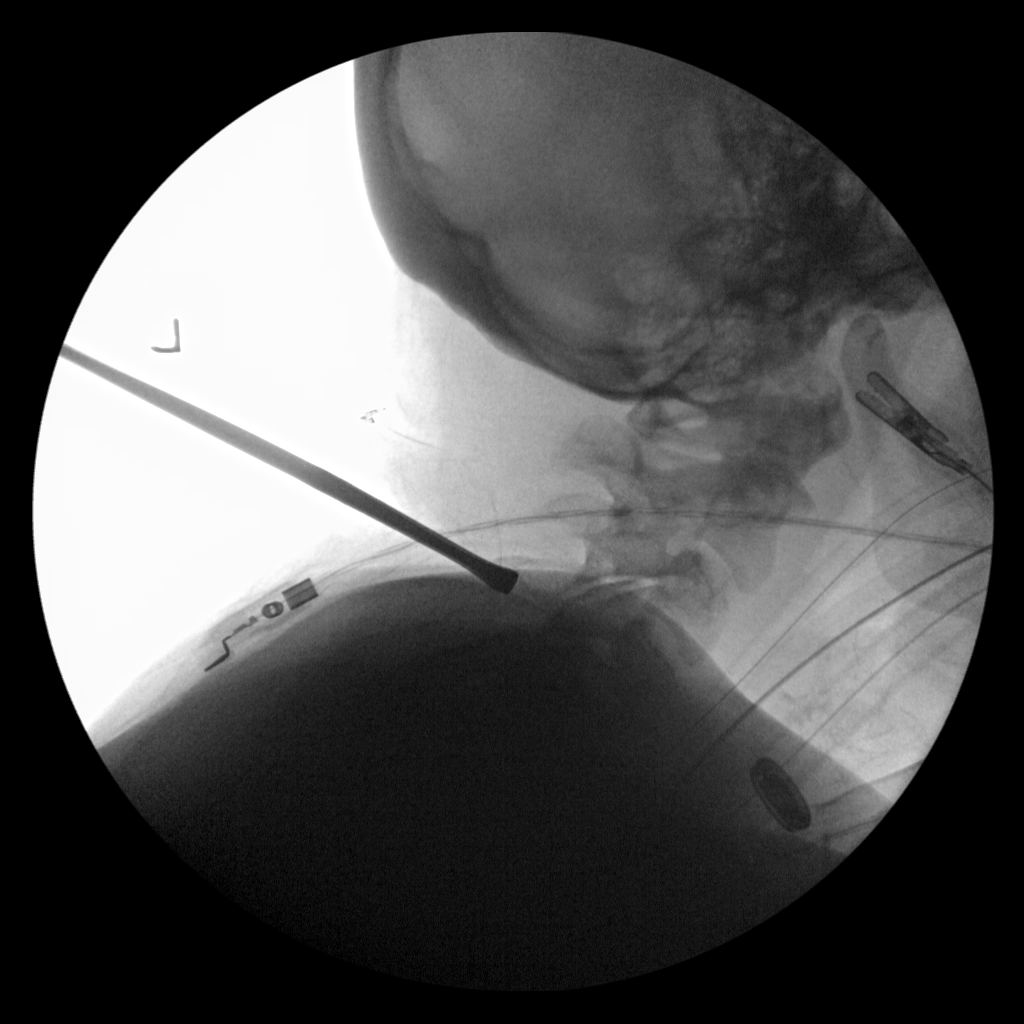

[3 of 3 positions shown; findings below may reference images not displayed]

FINDINGS: Three intraoperative lateral radiographs of the cervical
spine were obtained during left C3-4 and C6-7 foraminotomy.

These images were obtained for intraoperative localization and have
been submitted postoperatively.

Please refer to the performing providers notes for description of
the procedure.
IMPRESSION: Intraoperative cervical spine radiographs as described above.

## 2012-12-08 SURGERY — POSTERIOR CERVICAL FUSION/FORAMINOTOMY LEVEL 2
Anesthesia: General | Site: Spine Cervical | Wound class: Clean

## 2012-12-08 MED ORDER — FLEET ENEMA 7-19 GM/118ML RE ENEM: 1.0000 | ENEMA | Freq: Once | RECTAL | Status: AC | PRN

## 2012-12-08 MED ORDER — ACETAMINOPHEN 10 MG/ML IV SOLN
1000.0000 mg | Freq: Once | INTRAVENOUS | Status: AC
  Administered 2012-12-08: 1000 mg via INTRAVENOUS

## 2012-12-08 MED ORDER — ACETAMINOPHEN 10 MG/ML IV SOLN
INTRAVENOUS | Status: AC
  Filled 2012-12-08: qty 100

## 2012-12-08 MED ORDER — PROMETHAZINE HCL 25 MG/ML IJ SOLN: 6.2500 mg | INTRAMUSCULAR | Status: DC | PRN

## 2012-12-08 MED ORDER — PANTOPRAZOLE SODIUM 40 MG PO TBEC
80.0000 mg | DELAYED_RELEASE_TABLET | Freq: Every day | ORAL | Status: DC
  Administered 2012-12-09: 80 mg via ORAL
  Filled 2012-12-08: qty 2

## 2012-12-08 MED ORDER — KETOROLAC TROMETHAMINE 30 MG/ML IJ SOLN
30.0000 mg | Freq: Four times a day (QID) | INTRAMUSCULAR | Status: AC
  Administered 2012-12-08 – 2012-12-09 (×4): 30 mg via INTRAVENOUS
  Filled 2012-12-08 (×3): qty 1

## 2012-12-08 MED ORDER — GLYCOPYRROLATE 0.2 MG/ML IJ SOLN
INTRAMUSCULAR | Status: DC | PRN
  Administered 2012-12-08: .7 mg via INTRAVENOUS

## 2012-12-08 MED ORDER — MENTHOL 3 MG MT LOZG: 1.0000 | LOZENGE | OROMUCOSAL | Status: DC | PRN

## 2012-12-08 MED ORDER — NEOSTIGMINE METHYLSULFATE 1 MG/ML IJ SOLN
INTRAMUSCULAR | Status: DC | PRN
  Administered 2012-12-08: 4 mg via INTRAVENOUS

## 2012-12-08 MED ORDER — MEPERIDINE HCL 25 MG/ML IJ SOLN: 6.2500 mg | INTRAMUSCULAR | Status: DC | PRN

## 2012-12-08 MED ORDER — CEFAZOLIN SODIUM 1-5 GM-% IV SOLN
1.0000 g | Freq: Three times a day (TID) | INTRAVENOUS | Status: AC
  Administered 2012-12-08 – 2012-12-09 (×2): 1 g via INTRAVENOUS
  Filled 2012-12-08 (×2): qty 50

## 2012-12-08 MED ORDER — BUPIVACAINE-EPINEPHRINE 0.5% -1:200000 IJ SOLN
INTRAMUSCULAR | Status: DC | PRN
  Administered 2012-12-08: 16 mL

## 2012-12-08 MED ORDER — MIDAZOLAM HCL 2 MG/2ML IJ SOLN: 0.5000 mg | Freq: Once | INTRAMUSCULAR | Status: DC | PRN

## 2012-12-08 MED ORDER — OXYCODONE HCL 5 MG PO TABS: 5.0000 mg | ORAL_TABLET | Freq: Once | ORAL | Status: DC | PRN

## 2012-12-08 MED ORDER — SENNOSIDES-DOCUSATE SODIUM 8.6-50 MG PO TABS: 1.0000 | ORAL_TABLET | Freq: Every evening | ORAL | Status: DC | PRN

## 2012-12-08 MED ORDER — KCL IN DEXTROSE-NACL 20-5-0.45 MEQ/L-%-% IV SOLN
INTRAVENOUS | Status: DC
  Administered 2012-12-08: 21:00:00 via INTRAVENOUS
  Filled 2012-12-08 (×3): qty 1000

## 2012-12-08 MED ORDER — OXYCODONE HCL 5 MG/5ML PO SOLN: 5.0000 mg | Freq: Once | ORAL | Status: DC | PRN

## 2012-12-08 MED ORDER — HYDROMORPHONE HCL PF 1 MG/ML IJ SOLN
INTRAMUSCULAR | Status: AC
  Filled 2012-12-08: qty 1

## 2012-12-08 MED ORDER — ONDANSETRON HCL 4 MG/2ML IJ SOLN
INTRAMUSCULAR | Status: DC | PRN
  Administered 2012-12-08: 4 mg via INTRAVENOUS

## 2012-12-08 MED ORDER — THROMBIN 20000 UNITS EX SOLR
CUTANEOUS | Status: AC
  Filled 2012-12-08: qty 20000

## 2012-12-08 MED ORDER — MIDAZOLAM HCL 5 MG/5ML IJ SOLN
INTRAMUSCULAR | Status: DC | PRN
  Administered 2012-12-08: 2 mg via INTRAVENOUS

## 2012-12-08 MED ORDER — FENTANYL CITRATE 0.05 MG/ML IJ SOLN
INTRAMUSCULAR | Status: DC | PRN
  Administered 2012-12-08: 250 ug via INTRAVENOUS
  Administered 2012-12-08 (×5): 50 ug via INTRAVENOUS

## 2012-12-08 MED ORDER — DOCUSATE SODIUM 100 MG PO CAPS
100.0000 mg | ORAL_CAPSULE | Freq: Two times a day (BID) | ORAL | Status: DC
  Administered 2012-12-08 – 2012-12-09 (×2): 100 mg via ORAL
  Filled 2012-12-08 (×2): qty 1

## 2012-12-08 MED ORDER — OXYCODONE-ACETAMINOPHEN 5-325 MG PO TABS
1.0000 | ORAL_TABLET | ORAL | Status: DC | PRN
  Administered 2012-12-09: 2 via ORAL
  Filled 2012-12-08: qty 2

## 2012-12-08 MED ORDER — MORPHINE SULFATE 2 MG/ML IJ SOLN
1.0000 mg | INTRAMUSCULAR | Status: DC | PRN
  Administered 2012-12-08 (×2): 2 mg via INTRAVENOUS
  Filled 2012-12-08 (×2): qty 1

## 2012-12-08 MED ORDER — ZOLPIDEM TARTRATE 5 MG PO TABS: 5.0000 mg | ORAL_TABLET | Freq: Every evening | ORAL | Status: DC | PRN

## 2012-12-08 MED ORDER — SODIUM CHLORIDE 0.9 % IV SOLN: 250.0000 mL | INTRAVENOUS | Status: DC

## 2012-12-08 MED ORDER — THROMBIN 20000 UNITS EX KIT
PACK | CUTANEOUS | Status: DC | PRN
  Administered 2012-12-08: 14:00:00 via TOPICAL

## 2012-12-08 MED ORDER — DIAZEPAM 5 MG PO TABS
5.0000 mg | ORAL_TABLET | Freq: Three times a day (TID) | ORAL | Status: DC | PRN
  Administered 2012-12-09: 5 mg via ORAL
  Filled 2012-12-08: qty 1

## 2012-12-08 MED ORDER — HYDROMORPHONE HCL PF 1 MG/ML IJ SOLN
0.2500 mg | INTRAMUSCULAR | Status: DC | PRN
  Administered 2012-12-08 (×3): 0.5 mg via INTRAVENOUS

## 2012-12-08 MED ORDER — LIDOCAINE HCL (CARDIAC) 20 MG/ML IV SOLN
INTRAVENOUS | Status: DC | PRN
  Administered 2012-12-08: 20 mg via INTRAVENOUS

## 2012-12-08 MED ORDER — SODIUM CHLORIDE 0.9 % IJ SOLN
3.0000 mL | Freq: Two times a day (BID) | INTRAMUSCULAR | Status: DC
  Administered 2012-12-08: 3 mL via INTRAVENOUS

## 2012-12-08 MED ORDER — PHENOL 1.4 % MT LIQD
1.0000 | OROMUCOSAL | Status: DC | PRN
  Administered 2012-12-08: 1 via OROMUCOSAL
  Filled 2012-12-08: qty 177

## 2012-12-08 MED ORDER — 0.9 % SODIUM CHLORIDE (POUR BTL) OPTIME
TOPICAL | Status: DC | PRN
  Administered 2012-12-08: 1000 mL

## 2012-12-08 MED ORDER — HYDROCODONE-ACETAMINOPHEN 5-325 MG PO TABS
1.0000 | ORAL_TABLET | ORAL | Status: DC | PRN
  Administered 2012-12-09 (×2): 2 via ORAL
  Administered 2012-12-09: 1 via ORAL
  Filled 2012-12-08: qty 1
  Filled 2012-12-08 (×2): qty 2

## 2012-12-08 MED ORDER — ACETAMINOPHEN 650 MG RE SUPP: 650.0000 mg | RECTAL | Status: DC | PRN

## 2012-12-08 MED ORDER — BUPIVACAINE-EPINEPHRINE PF 0.5-1:200000 % IJ SOLN
INTRAMUSCULAR | Status: AC
  Filled 2012-12-08: qty 30

## 2012-12-08 MED ORDER — PHENYLEPHRINE HCL 10 MG/ML IJ SOLN
INTRAMUSCULAR | Status: DC | PRN
  Administered 2012-12-08: 120 ug via INTRAVENOUS

## 2012-12-08 MED ORDER — ACETAMINOPHEN 325 MG PO TABS: 650.0000 mg | ORAL_TABLET | ORAL | Status: DC | PRN

## 2012-12-08 MED ORDER — SODIUM CHLORIDE 0.9 % IJ SOLN: 3.0000 mL | INTRAMUSCULAR | Status: DC | PRN

## 2012-12-08 MED ORDER — LACTATED RINGERS IV SOLN
INTRAVENOUS | Status: DC | PRN
  Administered 2012-12-08 (×3): via INTRAVENOUS

## 2012-12-08 MED ORDER — LACTATED RINGERS IV SOLN
INTRAVENOUS | Status: DC
  Administered 2012-12-08: 12:00:00 via INTRAVENOUS

## 2012-12-08 MED ORDER — KETOROLAC TROMETHAMINE 30 MG/ML IJ SOLN
INTRAMUSCULAR | Status: AC
  Filled 2012-12-08: qty 1

## 2012-12-08 MED ORDER — SODIUM CHLORIDE 0.9 % IV SOLN
10.0000 mg | INTRAVENOUS | Status: DC | PRN
  Administered 2012-12-08: 40 ug/min via INTRAVENOUS

## 2012-12-08 MED ORDER — BISACODYL 10 MG RE SUPP: 10.0000 mg | Freq: Every day | RECTAL | Status: DC | PRN

## 2012-12-08 MED ORDER — ONDANSETRON HCL 4 MG/2ML IJ SOLN
4.0000 mg | INTRAMUSCULAR | Status: DC | PRN
  Administered 2012-12-08 – 2012-12-09 (×2): 4 mg via INTRAVENOUS
  Filled 2012-12-08 (×2): qty 2

## 2012-12-08 MED ORDER — ROCURONIUM BROMIDE 100 MG/10ML IV SOLN
INTRAVENOUS | Status: DC | PRN
  Administered 2012-12-08: 10 mg via INTRAVENOUS
  Administered 2012-12-08: 50 mg via INTRAVENOUS
  Administered 2012-12-08: 20 mg via INTRAVENOUS
  Administered 2012-12-08 (×2): 10 mg via INTRAVENOUS

## 2012-12-08 MED ORDER — PROPOFOL 10 MG/ML IV BOLUS
INTRAVENOUS | Status: DC | PRN
  Administered 2012-12-08: 150 mg via INTRAVENOUS

## 2012-12-08 SURGICAL SUPPLY — 55 items
ADH SKN CLS APL DERMABOND .7 (GAUZE/BANDAGES/DRESSINGS) ×2
BLADE SURG ROTATE 9660 (MISCELLANEOUS) IMPLANT
BUR MATCHSTICK NEURO 3.0 LAGG (BURR) ×2 IMPLANT
BUR ROUND FLUTED 4 SOFT TCH (BURR) IMPLANT
BUR SABER RD CUTTING 3.0 (BURR) IMPLANT
CLOTH BEACON ORANGE TIMEOUT ST (SAFETY) ×2 IMPLANT
COLLAR CERV LO CONTOUR FIRM DE (SOFTGOODS) ×2 IMPLANT
CORDS BIPOLAR (ELECTRODE) ×2 IMPLANT
COVER SURGICAL LIGHT HANDLE (MISCELLANEOUS) ×2 IMPLANT
DERMABOND ADVANCED (GAUZE/BANDAGES/DRESSINGS) ×2
DERMABOND ADVANCED .7 DNX12 (GAUZE/BANDAGES/DRESSINGS) ×1 IMPLANT
DRAPE C-ARM 42X72 X-RAY (DRAPES) ×4 IMPLANT
DRAPE INCISE IOBAN 66X45 STRL (DRAPES) IMPLANT
DRAPE LAPAROTOMY T 102X78X121 (DRAPES) ×2 IMPLANT
DRAPE MICROSCOPE LEICA (MISCELLANEOUS) IMPLANT
DRAPE PROXIMA HALF (DRAPES) ×4 IMPLANT
DRAPE SURG 17X23 STRL (DRAPES) ×4 IMPLANT
DRSG MEPILEX BORDER 4X4 (GAUZE/BANDAGES/DRESSINGS) IMPLANT
DRSG MEPILEX BORDER 4X8 (GAUZE/BANDAGES/DRESSINGS) ×2 IMPLANT
DURAPREP 26ML APPLICATOR (WOUND CARE) ×2 IMPLANT
ELECT COATED BLADE 2.86 ST (ELECTRODE) ×2 IMPLANT
ELECT REM PT RETURN 9FT ADLT (ELECTROSURGICAL) ×2
ELECTRODE REM PT RTRN 9FT ADLT (ELECTROSURGICAL) ×1 IMPLANT
EVACUATOR 1/8 PVC DRAIN (DRAIN) IMPLANT
GLOVE BIOGEL PI IND STRL 7.5 (GLOVE) ×1 IMPLANT
GLOVE BIOGEL PI INDICATOR 7.5 (GLOVE) ×1
GLOVE ECLIPSE 7.0 STRL STRAW (GLOVE) ×2 IMPLANT
GLOVE ECLIPSE 8.5 STRL (GLOVE) ×2 IMPLANT
GLOVE SURG 8.5 LATEX PF (GLOVE) ×2 IMPLANT
GOWN PREVENTION PLUS LG XLONG (DISPOSABLE) IMPLANT
GOWN PREVENTION PLUS XXLARGE (GOWN DISPOSABLE) ×2 IMPLANT
GOWN STRL NON-REIN LRG LVL3 (GOWN DISPOSABLE) ×4 IMPLANT
KIT BASIN OR (CUSTOM PROCEDURE TRAY) ×2 IMPLANT
KIT ROOM TURNOVER OR (KITS) ×2 IMPLANT
MANIFOLD NEPTUNE II (INSTRUMENTS) ×2 IMPLANT
NS IRRIG 1000ML POUR BTL (IV SOLUTION) ×2 IMPLANT
PACK ORTHO CERVICAL (CUSTOM PROCEDURE TRAY) ×2 IMPLANT
PAD ARMBOARD 7.5X6 YLW CONV (MISCELLANEOUS) ×4 IMPLANT
PATTIES SURGICAL .25X.25 (GAUZE/BANDAGES/DRESSINGS) ×2 IMPLANT
PATTIES SURGICAL .75X.75 (GAUZE/BANDAGES/DRESSINGS) ×2 IMPLANT
SPONGE GAUZE 4X4 12PLY (GAUZE/BANDAGES/DRESSINGS) ×2 IMPLANT
SPONGE SURGIFOAM ABS GEL 100 (HEMOSTASIS) IMPLANT
SUT ETHIBOND CT1 BRD #0 30IN (SUTURE) ×2 IMPLANT
SUT VIC AB 0 CT1 27 (SUTURE)
SUT VIC AB 0 CT1 27XBRD ANBCTR (SUTURE) IMPLANT
SUT VIC AB 2-0 CT1 27 (SUTURE) ×2
SUT VIC AB 2-0 CT1 TAPERPNT 27 (SUTURE) IMPLANT
SUT VIC AB 2-0 UR6 27 (SUTURE) IMPLANT
SUT VICRYL 0 UR6 27IN ABS (SUTURE) ×3 IMPLANT
SUT VICRYL 4-0 PS2 18IN ABS (SUTURE) ×2 IMPLANT
SUT WIRE 16GA (Orthopedic Implant) ×2 IMPLANT
TOWEL OR 17X24 6PK STRL BLUE (TOWEL DISPOSABLE) ×2 IMPLANT
TOWEL OR 17X26 10 PK STRL BLUE (TOWEL DISPOSABLE) ×2 IMPLANT
TRAY FOLEY CATH 14FR (SET/KITS/TRAYS/PACK) IMPLANT
WATER STERILE IRR 1000ML POUR (IV SOLUTION) ×2 IMPLANT

## 2012-12-08 NOTE — Progress Notes (Signed)
Pt arrived to Rm 5N21 from PACU. Transferred pt max assist to hospital bed from stretcher. On O2 2L per nasal cannula. No acute distress. Soft neck collar on. Oriented to room & equipment. Call light within reach, bed in low position. Cont to monitor.

## 2012-12-08 NOTE — Brief Op Note (Signed)
12/08/2012  4:00 PM  PATIENT:  Tammy Li  47 y.o. 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 Jean Rosenthal Dr. Diamantina Monks. Supplemented with local infiltration with Marcaine 1/2% with 1/200,000 epi.  EBL:  Total I/O In: 1000 [I.V.:1000] Out: 700 [Urine:600; Other:50; Blood:50]  BLOOD ADMINISTERED:none  DRAINS: Urinary Catheter (Foley)   LOCAL MEDICATIONS USED:  MARCAINE    and Amount: 20 ml  SPECIMEN:  No Specimen  DISPOSITION OF SPECIMEN:  N/A  COUNTS:  YES  TOURNIQUET:  * No tourniquets in log *  DICTATION: .Dragon Dictation  PLAN OF CARE: Admit to inpatient   PATIENT DISPOSITION:  PACU - hemodynamically stable.   Delay start of Pharmacological VTE agent (>24hrs) due to surgical blood loss or risk of bleeding: yes

## 2012-12-08 NOTE — Transfer of Care (Signed)
Immediate Anesthesia Transfer of Care Note  Patient: Tammy Li  Procedure(s) Performed: Procedure(s): Left C3-4 and C6-7 foraminotomy with excision of HNP (N/A)  Patient Location: PACU  Anesthesia Type:General  Level of Consciousness: awake, alert , oriented and patient cooperative  Airway & Oxygen Therapy: Patient Spontanous Breathing and Patient connected to nasal cannula oxygen  Post-op Assessment: Report given to PACU RN, Post -op Vital signs reviewed and stable and Patient moving all extremities X 4  Post vital signs: Reviewed and stable  Complications: No apparent anesthesia complications

## 2012-12-08 NOTE — Anesthesia Postprocedure Evaluation (Signed)
Anesthesia Post Note  Patient: Tammy Li  Procedure(s) Performed: Procedure(s) (LRB): Left C3-4 and C6-7 foraminotomy with excision of HNP (N/A)  Anesthesia type: General  Patient location: PACU  Post pain: Pain level controlled and Adequate analgesia  Post assessment: Post-op Vital signs reviewed, Patient's Cardiovascular Status Stable, Respiratory Function Stable, Patent Airway and Pain level controlled  Last Vitals:  Filed Vitals:   12/08/12 1645  BP:   Pulse: 116  Temp:   Resp: 13    Post vital signs: Reviewed and stable  Level of consciousness: awake, alert  and oriented  Complications: No apparent anesthesia complications

## 2012-12-08 NOTE — Op Note (Signed)
12/08/2012  4:04 PM  PATIENT:  Tammy Li  47 y.o. female  MRN: [id number]  OPERATIVE REPORT  PRE-OPERATIVE DIAGNOSIS:  Left C3-4, left C6-7 HNP, Left C4 and C7 radiculopathy  POST-OPERATIVE DIAGNOSIS:  Left C3-4, left C6-7 HNP, Left C4 and C7 radiculopathy  PROCEDURE:  Procedure(s): Left C3-4 and C6-7 foraminotomy with excision of HNP    SURGEON:  [name], MD     ASSISTANT:  Maud Deed, PA-C  (Present throughout the entire procedure and necessary for completion of procedure in a timely manner)     ANESTHESIA:  General, Dr. Jean Rosenthal Dr. Diamantina Monks. Supplemented with local marcaine 1/2% with 1/200,000 epi.    COMPLICATIONS:  None.      PROCEDURE:The patient was met in the holding area, and the appropriate left C3-4 and left C6-7 cervical level identified and marked with "x" and my initials. All questions were answered and informed consent signed.   The patient was then transported to OR. The patient was then placed under general anesthesia without difficulty. A foley catheter was placed sterilely by OR nursing personnel. and transferred to the operating room table prone position Mayfield horseshoe with Candis Schatz. All pressure points well-padded PAS stockings.Shoulders taped down and skin over the posterior inferior aspect of the neck place in traction to decrease skin folds. The patient received appropriate preoperative antibiotic 2 grams ancef. prophylaxis.Time-out procedure was called and correct.   Sterile prep with DuraPrep and draped in the usual manner the shoulders were taped downwards and skin traction over the skin of the neck. Following DuraPrep draped in the usual manner. After timeout protocol. Spinal needle placed left side At the expected C3-4 and C6-7 levels, C-arm identified the needle posterior to the C4 mid spinous process and the lower needle directed at the superior aspect of the C6 spinous process. The skin incision was made approximately C6-7 about an  inch in length about 3/4 inch below the lower spinal needle and at the upper spinal needle one inch in length in the midline. This following infiltration of skin and subcutaneous layers with lidocaine 1% with 1-200,000 epinephrine total of 20 cc. Incision carried through skin and subcutaneous layers using 10 blade scalpel and electrocautery down to the level ligamentum nuchae. Incision made along the left lateral spinous process of C7 extending upwards to the C6 spinous process and at the superior incision site exposing the spinous process of C4. Clamp then placed at the spinous process of C4 and a penfield #4 placed over the upper aspect of the C7 spinous process and intraoperative C-arm fluoroscopy identified the clamp at the C4 spinous process level and the lower penfield #4 at the upper lamina of C7. A marking pen  was used to mark the spinous process of C7 and C4. Electrocautery then used to carefully incise the cervical muscles off the left lateral aspect of the spinous process of C7 and C6. Dividing the  spinous muscles off of the inferior aspect lamina at C6 exposing the C6-7 posterior aspect of the interlaminar space. The magnification headlamp were used during this portion procedure. Boss McCollough retractor was inserted. High-speed bur was used to remove a small portion of bone from the inferior aspect of lamina of C6 and the medial 20% of the inferior articular process of C6. Further thinning the superior aspect of the lamina left C7. A 1 mm Kerrison was then used to remove him from superior aspect of the lamina C7 and the medial aspect of the infra-articular process  of C6 the 20%, exposing the superior articular process of C7. A 1 mm Kerrison was removed bone off the medial aspect of the superior articular process of C7 resecting 20% of the medial aspect of the superior articular process of C7. Ligamentum flavum then easily lifted superiorly  electrocautery unit cauterizing epidural veins deep to the  ligamentum flavum  then resecting the ligamentum flavum. The operating room microscope was draped sterilely and brought into the field. Under the operating room microscope the epidural vein layer overlying the posterior aspect of the thecal sac and the C7 nerve root was then carefully lifted using a micro-titanium then cauterized using bipolar electrocautery a # 15 blade scalpel then used to incise this overlying the C7nerve root releasing the vascular leash a backward angle 3-0 microcurette then used to remove a small portion of bone off the superior and medial aspect of the pedicle further mobilizing the C7 nerve root bipolar electrocautery to control all bleeding within the axillary area of the C7 nerve. Bone wax was applied to bleeding cancellus bone surfaces are excellent hemostasis obtained  The disc explored using a Penfield 4 found to be protruded. No significant disc material found to be herniated and uncovertebral joint found to be prominent resulting in left C7 foramenal narrowing. Following this then hemostasis was obtained using thrombin-soaked Gelfoam and micro-pledgettes. When complete hemostasis was obtained all gel foam was removed I nerve hook could be easily passed out the neuroforamen without the lateral aspect of the C7 pedicle demonstrate the C7 neuroforamen completely decompressed. Irrigation was carried out no active bleeding was present. The incision was closed by approximating the ligamentum nuchae with 0 Vicryl sutures. The subcutaneous layers approximated with interrupted 0 Vicryl suture more superficial layers with interrupted 2-0 Vicryl sutures and the skin closed with interrupted 4-0 Vicryl sutures.  Electrocautery then used to carefully incise the cervical muscles off the left  lateral aspect of the spinous process of C3 and C4, Dividing the  spinous muscles off of the inferior aspect lamina at C3 exposing the C3-4 posterior aspect of the interlaminar space. The magnification  headlamp were used during this portion procedure. MIS retractor was inserted. High-speed bur was used to remove a small portion of bone from the inferior aspect of lamina of C3 and the medial 20% of the inferior articular process of C3. Further thinning the superior aspect of the lamina left C4.  A 1 mm Kerrison was then used to remove him from superior aspect of the lamina C4 and the medial aspect of the infra-articular process of C3 the 20%, exposing the superior articular process of C4. A 1 mm Kerrison was removed bone off the medial aspect of the superior articular process of C4 resecting 20% of the medial aspect of the superior articular process of C4. Ligamentum flavum then easily lifted superiorly  electrocautery unit cauterizing epidural veins deep to the ligamentum flavum then resecting the ligamentum flavum. The operating room microscope was draped sterilely and brought into the field. Under the operating room microscope the epidural vein layer overlying the posterior aspect of the thecal sac and the C4 nerve root was then carefully lifted using a micro-titanium then cauterized using bipolar electrocautery a # 15 blade scalpel then used to incise this overlying the C4 nerve root releasing the vascular leash a backward angle 3-0 microcurette then used to remove a small portion of bone off the superior and medial aspect of the pedicle further mobilizing the C4 nerve root bipolar electrocautery to control  all bleeding within the axillary area of the C4 nerve. Bone wax was applied to bleeding cancellus bone surfaces are excellent hemostasis obtained  The disc explored using a Penfield 4 found to be protruded. No significant disc material found to be herniated and uncovertebral joint found to be prominent resulting in left C4 foramenal narrowing. Following this then hemostasis was obtained using thrombin-soaked Gelfoam and micro-pledgettes. When complete hemostasis was obtained all gel foam was removed I nerve  hook could be easily passed out the neuroforamen without the lateral aspect of the C4 pedicle demonstrate the C4 neuroforamen completely decompressed. Irrigation was carried out no active bleeding was present. The incision was closed by approximating the ligamentum nuchae with 0 Vicryl sutures. The subcutaneous layers approximated with interrupted 0 Vicryl suture more superficial layers with interrupted 2-0 Vicryl sutures and the skin closed with interrupted 4-0 Vicryl sutures. Dermabond was applied to both incision site.then MedPlex bandage.  Dermabond was applied then a long MedPlex bandage bridging both incision sites. Soft cervical collar placed.  All instrument and sponge counts were correct. Patient was then returned to supine position on her stretcher. Returned to recovery room in satisfactory condition.   Physician assistant's responsibilities: Maud Deed PA-C perform the duties of assistant physician and surgeon during this case present from the beginning of the case to the end of the case. She assisted with careful retraction of neural structures suctioning about her elements including cervical cord and C4 and C7 nerve roots. Performed closure of the incision on the ligamentum nuchae to the skin and application of dressing. She assisted in positioning the patient had removal the patient from the OR table to her stretcher.   Mirra Basilio E 12/08/2012, 4:04 PM

## 2012-12-08 NOTE — Preoperative (Signed)
Beta Blockers   Reason not to administer Beta Blockers:Not Applicable 

## 2012-12-08 NOTE — Anesthesia Procedure Notes (Signed)
Procedure Name: Intubation Date/Time: 12/08/2012 12:57 PM Performed by: Rogelia Boga Pre-anesthesia Checklist: Patient identified, Emergency Drugs available, Suction available, Patient being monitored and Timeout performed Patient Re-evaluated:Patient Re-evaluated prior to inductionOxygen Delivery Method: Circle system utilized Preoxygenation: Pre-oxygenation with 100% oxygen Intubation Type: IV induction Ventilation: Mask ventilation without difficulty Laryngoscope Size: Mac and 4 Grade View: Grade I Tube type: Oral Tube size: 7.5 mm Number of attempts: 1 Airway Equipment and Method: Stylet Placement Confirmation: ETT inserted through vocal cords under direct vision,  positive ETCO2 and breath sounds checked- equal and bilateral Secured at: 22 cm Tube secured with: Tape Dental Injury: Teeth and Oropharynx as per pre-operative assessment

## 2012-12-08 NOTE — OR Nursing (Signed)
All OR documentation from [phone] done by [facility]

## 2012-12-08 NOTE — Anesthesia Preprocedure Evaluation (Addendum)
Anesthesia Evaluation  Patient identified by MRN, date of birth, ID band Patient awake    Reviewed: Allergy & Precautions, H&P , NPO status , Patient's Chart, lab work & pertinent test results  History of Anesthesia Complications Negative for: history of anesthetic complications  Airway Mallampati: I TM Distance: >3 FB Neck ROM: Full    Dental  (+) Dental Advisory Given and Chipped   Pulmonary Current Smoker,  breath sounds clear to auscultation  Pulmonary exam normal       Cardiovascular negative cardio ROS  Rhythm:Regular Rate:Normal     Neuro/Psych Anxiety Cerebral palsy: leg surgery x 2 as child, does walk  Neuromuscular disease    GI/Hepatic Neg liver ROS, GERD-  Medicated and Controlled,  Endo/Other  Morbid obesity  Renal/GU negative Renal ROS     Musculoskeletal   Abdominal (+) + obese,   Peds  Hematology   Anesthesia Other Findings   Reproductive/Obstetrics                          Anesthesia Physical Anesthesia Plan  ASA: III  Anesthesia Plan: General   Post-op Pain Management:    Induction: Intravenous  Airway Management Planned: Oral ETT  Additional Equipment:   Intra-op Plan:   Post-operative Plan: Extubation in OR  Informed Consent: I have reviewed the patients History and Physical, chart, labs and discussed the procedure including the risks, benefits and alternatives for the proposed anesthesia with the patient or authorized representative who has indicated his/her understanding and acceptance.   Dental advisory given  Plan Discussed with: CRNA, Surgeon and Anesthesiologist  Anesthesia Plan Comments: (Plan routine monitors, GETA)       Anesthesia Quick Evaluation

## 2012-12-09 MED ORDER — OXYCODONE-ACETAMINOPHEN 10-325 MG PO TABS: 1.0000 | ORAL_TABLET | ORAL | Status: DC | PRN

## 2012-12-09 MED ORDER — ONDANSETRON HCL 8 MG PO TABS: 8.0000 mg | ORAL_TABLET | Freq: Three times a day (TID) | ORAL | Status: DC | PRN

## 2012-12-09 MED ORDER — ONDANSETRON HCL 4 MG PO TABS: 4.0000 mg | ORAL_TABLET | Freq: Three times a day (TID) | ORAL | Status: DC | PRN

## 2012-12-09 NOTE — Discharge Summary (Addendum)
Patient ID: Tammy Li MRN: [id number] DOB/AGE: April 04, 1966 47 y.o.  Admit date: 12/08/2012 Discharge date: 12/09/2012  Admission Diagnoses: Left C3-4, left C6-7 HNP, Left C4 and C7 radiculopathy Past Medical History  Diagnosis Date  . CP (cerebral palsy)   . Claustrophobia   . GERD (gastroesophageal reflux disease)   . Neuromuscular disorder     cp  . Headache   . Arthritis     Discharge Diagnoses:  Principal Problem:   HNP (herniated nucleus pulposus), cervical Active Problems:   Cervical spondylosis without myelopathy   Surgeries: Procedure(s): Left C3-4 and C6-7 foraminotomy with excision of HNP on 12/08/2012    Consultants:    Discharged Condition: Improved  Hospital Course: Tammy Li is an 47 y.o. female who was admitted 12/08/2012 with a chief complaint of No chief complaint on file. , and found to have a diagnosis of Left C3-4, left C6-7 HNP, Left C4 and C7 radiculopathy.  They were brought to the operating room on 12/08/2012 and underwent Procedure(s): Left C3-4 and C6-7 foraminotomy with excision of HNP.    They were given perioperative antibiotics: Anti-infectives   Start     Dose/Rate Route Frequency Ordered Stop   12/08/12 2115  ceFAZolin (ANCEF) IVPB 1 g/50 mL premix     1 g 100 mL/hr over 30 Minutes Intravenous Every 8 hours 12/08/12 1900 12/09/12 0623   12/08/12 0600  ceFAZolin (ANCEF) IVPB 2 g/50 mL premix     2 g 100 mL/hr over 30 Minutes Intravenous On call to O.R. 12/07/12 1309 12/08/12 1310    .  They were given sequential compression devices, early ambulation, no anti DVT prophylaxis to prevent perioperative epidural hematoma.  On POD#1 she was awake alert and oriented times 4. Baseline left hemiparesis due to Cerebropalsy has return to baseline. Left shoulder and arm pain markedly improve. She was discharged home following dressing change.  Recent vital signs: Patient Vitals for the past 24 hrs:  BP Temp Temp src Pulse Resp SpO2  12/09/12 0500  123/71 mmHg 98.6 F (37 C) Oral 87 18 99 %  12/09/12 0200 120/79 mmHg 98.5 F (36.9 C) Oral 92 18 98 %  12/08/12 2122 118/69 mmHg 98.1 F (36.7 C) Oral 85 18 99 %  12/08/12 1835 117/69 mmHg 98.6 F (37 C) Oral 93 16 99 %  12/08/12 1815 - - - 97 13 96 %  12/08/12 1807 111/79 mmHg - - 75 11 94 %  12/08/12 1800 - 98.5 F (36.9 C) - 77 13 92 %  12/08/12 1752 116/66 mmHg - - 84 12 93 %  12/08/12 1745 - - - 86 12 91 %  12/08/12 1737 128/80 mmHg - - 91 12 93 %  12/08/12 1730 - - - 96 11 97 %  12/08/12 1722 139/73 mmHg - - 112 14 96 %  12/08/12 1715 - - - 93 11 99 %  12/08/12 1707 127/86 mmHg - - 97 11 98 %  12/08/12 1700 - - - 100 14 98 %  12/08/12 1652 131/80 mmHg - - 103 13 98 %  12/08/12 1645 - - - 116 13 99 %  12/08/12 1637 134/71 mmHg - - 116 15 98 %  12/08/12 1636 - - - 116 - 99 %  12/08/12 1635 - 97.7 F (36.5 C) - - - -  .  Recent laboratory studies: Dg Cervical Spine 2-3 Views  12/08/2012  *RADIOLOGY REPORT*  Clinical Data: Left C4 and C7 radiculopathy  DG C-ARM 1-60 MIN,CERVICAL  SPINE - 2-3 VIEW  Technique:  C-arm fluoroscopic images were obtained intraoperatively and submitted for postoperative interpretation. Please see the performing provider's procedural report for the fluoroscopy time utilized.  Fluoroscopy time:  6 seconds  Comparison: MRI cervical spine dated 11/02/2012  Findings: Three intraoperative lateral radiographs of the cervical spine were obtained during left C3-4 and C6-7 foraminotomy.  These images were obtained for intraoperative localization and have been submitted postoperatively.  Please refer to the performing providers notes for description of the procedure.  IMPRESSION: Intraoperative cervical spine radiographs as described above.   Original Report Authenticated By: Charline Bills, M.D.    Dg C-arm 1-60 Min  12/08/2012  *RADIOLOGY REPORT*  Clinical Data: Left C4 and C7 radiculopathy  DG C-ARM 1-60 MIN,CERVICAL SPINE - 2-3 VIEW  Technique:  C-arm  fluoroscopic images were obtained intraoperatively and submitted for postoperative interpretation. Please see the performing provider's procedural report for the fluoroscopy time utilized.  Fluoroscopy time:  6 seconds  Comparison: MRI cervical spine dated 11/02/2012  Findings: Three intraoperative lateral radiographs of the cervical spine were obtained during left C3-4 and C6-7 foraminotomy.  These images were obtained for intraoperative localization and have been submitted postoperatively.  Please refer to the performing providers notes for description of the procedure.  IMPRESSION: Intraoperative cervical spine radiographs as described above.   Original Report Authenticated By: Charline Bills, M.D.     Discharge Medications:     Medication List    STOP taking these medications       HYDROcodone-acetaminophen 10-325 MG per tablet  Commonly known as:  NORCO      TAKE these medications       diazepam 5 MG tablet  Commonly known as:  VALIUM  Take 5-10 mg by mouth every 8 (eight) hours as needed (muscle spasm).     esomeprazole 40 MG capsule  Commonly known as:  NEXIUM  Take 40 mg by mouth daily before breakfast.     loratadine 10 MG tablet  Commonly known as:  CLARITIN  Take 10 mg by mouth daily.     ondansetron 8 MG tablet  Commonly known as:  ZOFRAN  Take 1 tablet (8 mg total) by mouth every 8 (eight) hours as needed for nausea (1/2 or one tablet per dose).     oxyCODONE-acetaminophen 10-325 MG per tablet  Commonly known as:  PERCOCET  Take 1 tablet by mouth every 4 (four) hours as needed for pain (May take 1/2 to 1 per dose.). MAXIMUM TOTAL ACETAMINOPHEN DOSE IS 4000 MG PER DAY     VITAMIN D-3 PO  Take 2 tablets by mouth daily.        Diagnostic Studies: Dg Cervical Spine 2-3 Views  12/08/2012  *RADIOLOGY REPORT*  Clinical Data: Left C4 and C7 radiculopathy  DG C-ARM 1-60 MIN,CERVICAL SPINE - 2-3 VIEW  Technique:  C-arm fluoroscopic images were obtained intraoperatively  and submitted for postoperative interpretation. Please see the performing provider's procedural report for the fluoroscopy time utilized.  Fluoroscopy time:  6 seconds  Comparison: MRI cervical spine dated 11/02/2012  Findings: Three intraoperative lateral radiographs of the cervical spine were obtained during left C3-4 and C6-7 foraminotomy.  These images were obtained for intraoperative localization and have been submitted postoperatively.  Please refer to the performing providers notes for description of the procedure.  IMPRESSION: Intraoperative cervical spine radiographs as described above.   Original Report Authenticated By: Charline Bills, M.D.    Dg C-arm 1-60 Min  12/08/2012  *RADIOLOGY REPORT*  Clinical Data: Left C4 and C7 radiculopathy  DG C-ARM 1-60 MIN,CERVICAL SPINE - 2-3 VIEW  Technique:  C-arm fluoroscopic images were obtained intraoperatively and submitted for postoperative interpretation. Please see the performing provider's procedural report for the fluoroscopy time utilized.  Fluoroscopy time:  6 seconds  Comparison: MRI cervical spine dated 11/02/2012  Findings: Three intraoperative lateral radiographs of the cervical spine were obtained during left C3-4 and C6-7 foraminotomy.  These images were obtained for intraoperative localization and have been submitted postoperatively.  Please refer to the performing providers notes for description of the procedure.  IMPRESSION: Intraoperative cervical spine radiographs as described above.   Original Report Authenticated By: Charline Bills, M.D.     They benefited maximally from their hospital stay and there were no complications.     Disposition:      Discharge Orders   Future Orders Complete By Expires     Call MD / Call 911  As directed     Comments:      If you experience chest pain or shortness of breath, CALL 911 and be transported to the hospital emergency room.  If you develope a fever above 101 F, pus (white drainage) or  increased drainage or redness at the wound, or calf pain, call your surgeon's office.    Constipation Prevention  As directed     Comments:      Drink plenty of fluids.  Prune juice may be helpful.  You may use a stool softener, such as Colace (over the counter) 100 mg twice a day.  Use MiraLax (over the counter) for constipation as needed.    Diet - low sodium heart healthy  As directed     Discharge instructions  As directed     Comments:      No lifting greater than 10 lbs. No overhead use of arms. Avoid bending,and twisting neck. Walk in house for first week them may start to get out slowly increasing distance up to one mile by 3 weeks post op. Keep incision dry for 3 days, may then bathe and wet incision when showering. Call if any fevers >101, chills, or increasing numbness or weakness or increased swelling or drainage.    Driving restrictions  As directed     Comments:      No driving .    Increase activity slowly as tolerated  As directed     Lifting restrictions  As directed     Comments:      No lifting for 6 weeks      Follow-up Information   Follow up with NITKA,JAMES E, MD. Schedule an appointment as soon as possible for a visit in 2 weeks.   Contact information:   [address] [place] [address] [phone]        Signed: [name] 12/09/2012, 10:24 AM

## 2012-12-09 NOTE — Progress Notes (Signed)
Patient ID: Tammy Li, female   DOB: 31-Dec-1965, 47 y.o.   MRN: [id number] Subjective: 1 Day Post-Op Procedure(s) (LRB): Left C3-4 and C6-7 foraminotomy with excision of HNP (N/A) Awake, alert and Ox4. Moderate pain. OOB to a recliner this am, voiding without difficulty. Patient reports pain as moderate.    Objective:   VITALS:  Temp:  [97.7 F (36.5 C)-98.6 F (37 C)] 98.6 F (37 C) (05/03 0500) Pulse Rate:  [75-116] 87 (05/03 0500) Resp:  [11-20] 18 (05/03 0500) BP: (103-139)/(66-86) 123/71 mmHg (05/03 0500) SpO2:  [91 %-99 %] 99 % (05/03 0500)  Neurologically intact ABD soft Neurovascular intact Sensation intact distally Intact pulses distally Dorsiflexion/Plantar flexion intact Incision: no drainage No cellulitis present   LABS  Recent Labs  12/06/12 1116  HGB 14.7  WBC 7.6  PLT 277    Recent Labs  12/06/12 1116  NA 137  K 3.6  CL 101  CO2 27  BUN 8  CREATININE 0.65  GLUCOSE 88   No results found for this basename: LABPT, INR,  in the last 72 hours   Assessment/Plan: 1 Day Post-Op Procedure(s) (LRB): Left C3-4 and C6-7 foraminotomy with excision of HNP (N/A)  Advance diet Discharge home with home health  NITKA,JAMES E 12/09/2012, 10:02 AM

## 2012-12-11 ENCOUNTER — Encounter (HOSPITAL_COMMUNITY): Payer: Self-pay | Admitting: Specialist

## 2016-02-19 ENCOUNTER — Encounter (HOSPITAL_COMMUNITY): Payer: Self-pay | Admitting: Emergency Medicine

## 2016-02-19 ENCOUNTER — Other Ambulatory Visit: Payer: Self-pay

## 2016-02-19 IMAGING — CT CT HEAD W/O CM
3 of 4 series · 16 of 47 positions shown, 19 images · non-contrast
Comparison: None.

CLINICAL DATA: Facial trauma.  Diaphoretic.  Alert and oriented.

EXAM:
CT HEAD WITHOUT CONTRAST
TECHNIQUE: Contiguous axial images were obtained from the base of the skull
through the vertex without intravenous contrast.

[Series 7: headseq 1.2 h45s · axial · 0.40mm/px · z∈[+1283,+1413]mm · 10 of 132 slices shown, 13 images]
[im 12/132  brain]
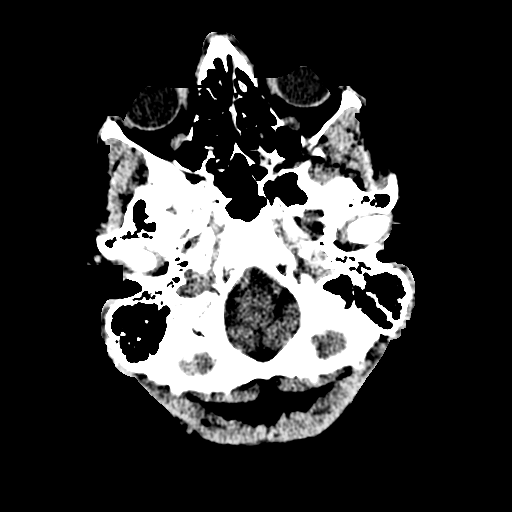
[im 12/132  bone]
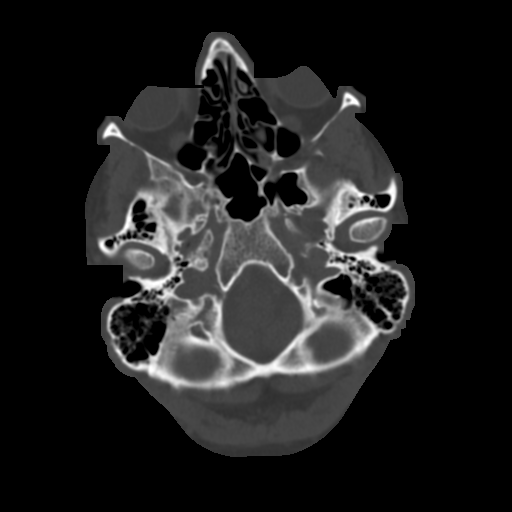
[im 23/132  brain]
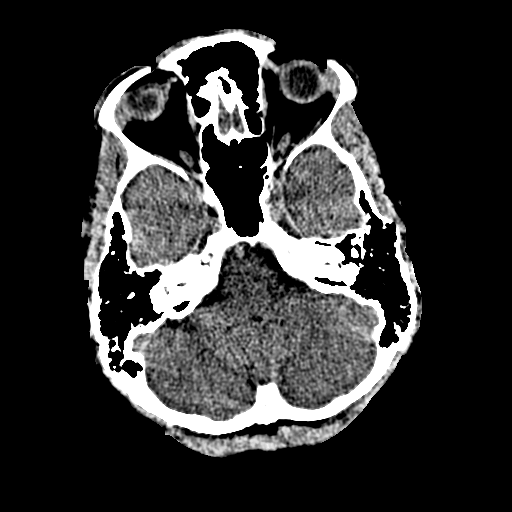
[im 35/132  brain]
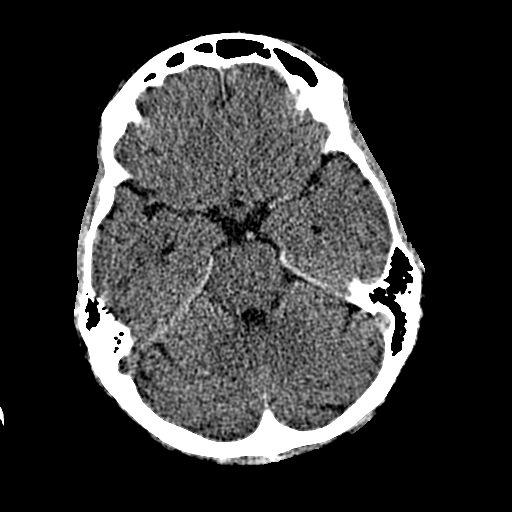
[im 46/132  brain]
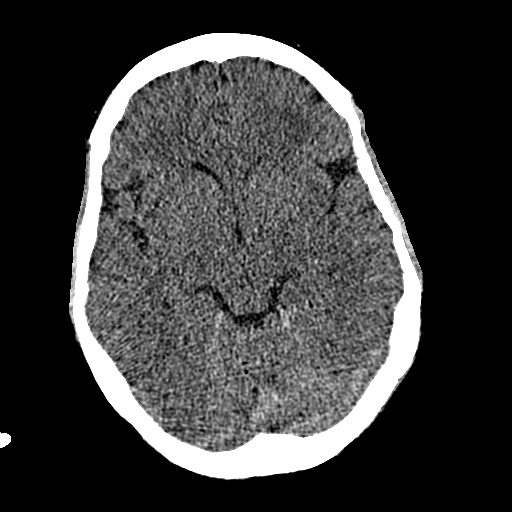
[im 57/132  brain]
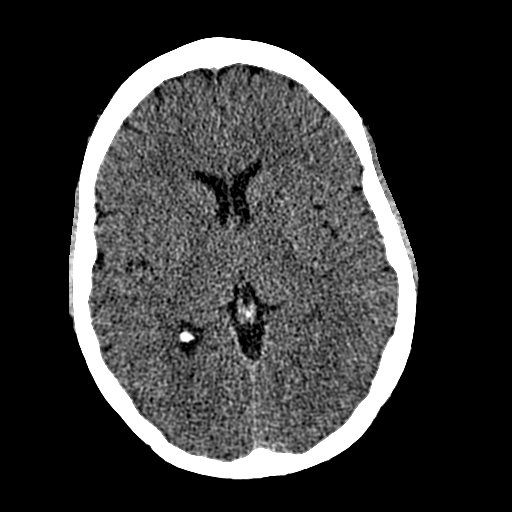
[im 57/132  bone]
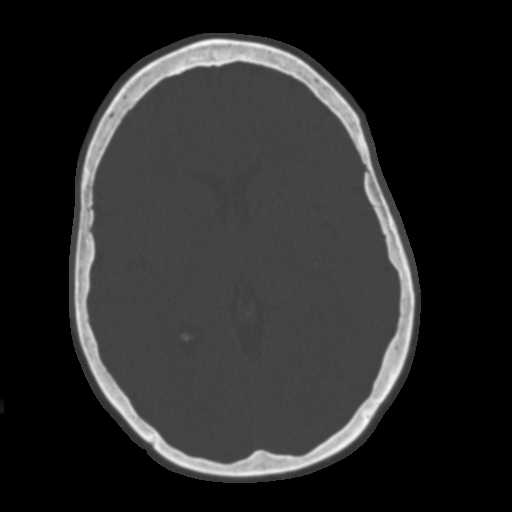
[im 75/132  brain]
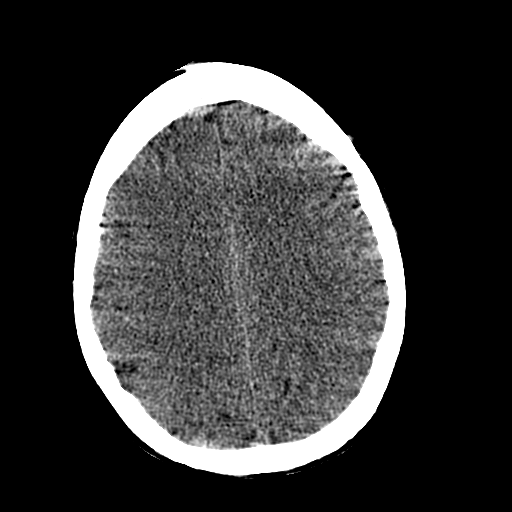
[im 86/132  brain]
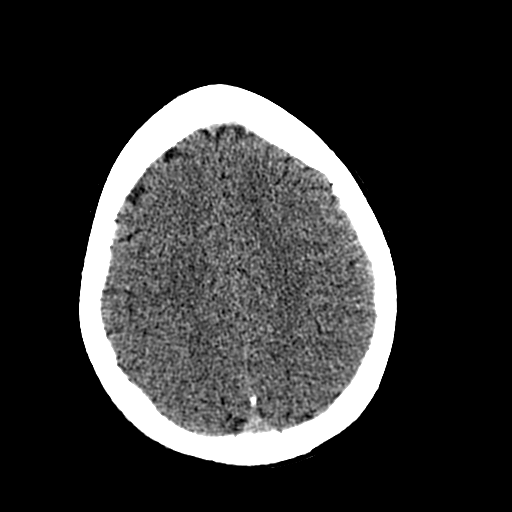
[im 97/132  brain]
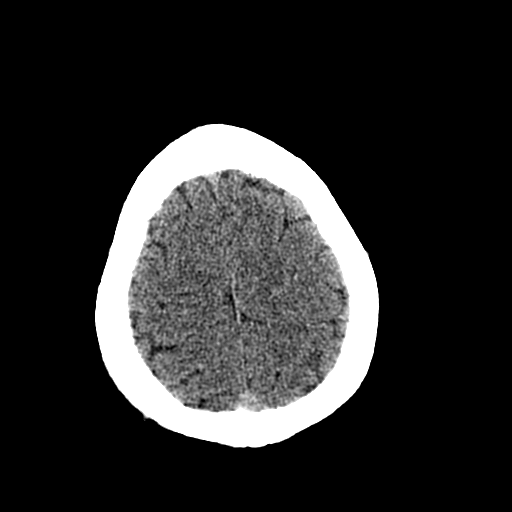
[im 109/132  brain]
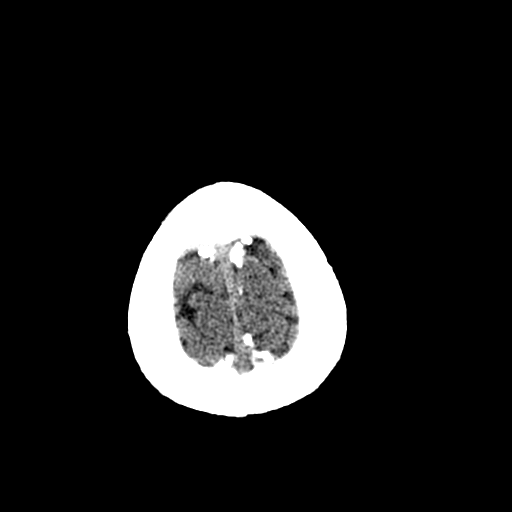
[im 109/132  bone]
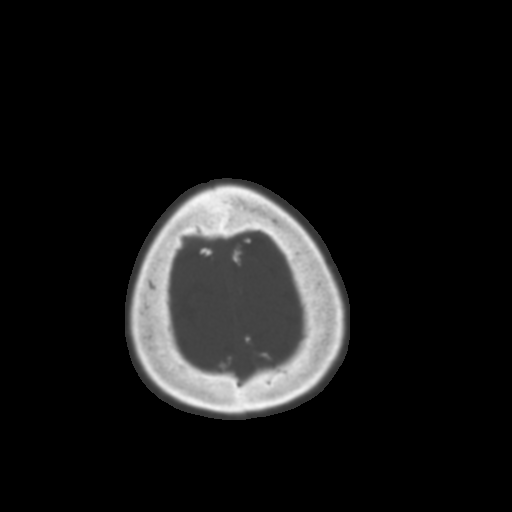
[im 120/132  brain]
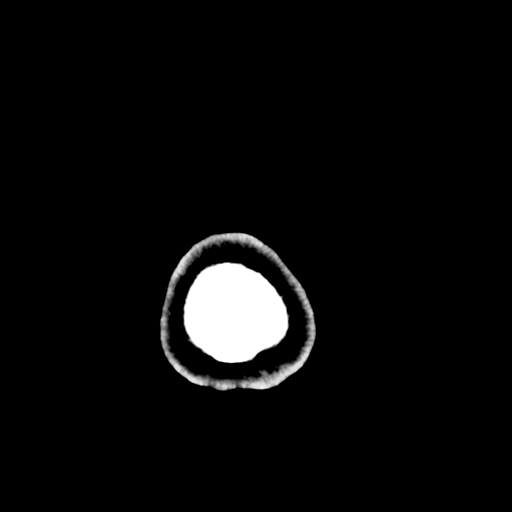

[Series 602: <mpr thick range> · coronal · 0.47mm/px · 3 of 59 slices shown]
[im 20/59  brain]
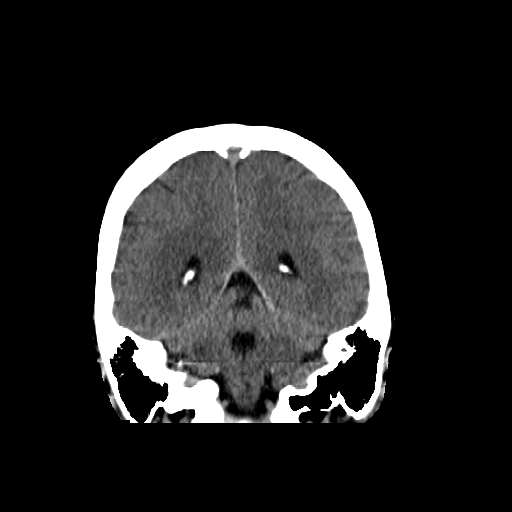
[im 26/59  brain]
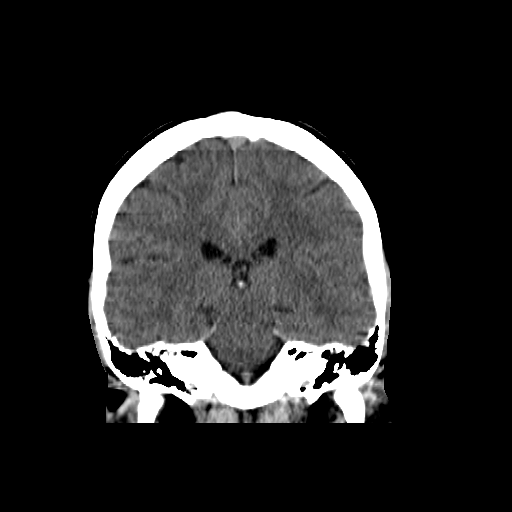
[im 33/59  brain]
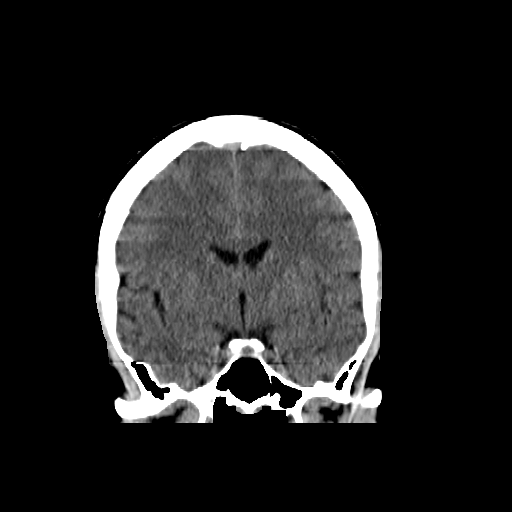

[Series 603: <mpr thick range(1)> · sagittal · 0.47mm/px · 3 of 47 slices shown]
[im 16/47  brain]
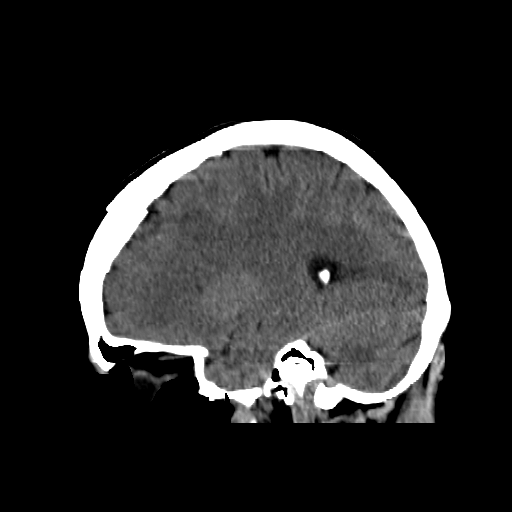
[im 24/47  brain]
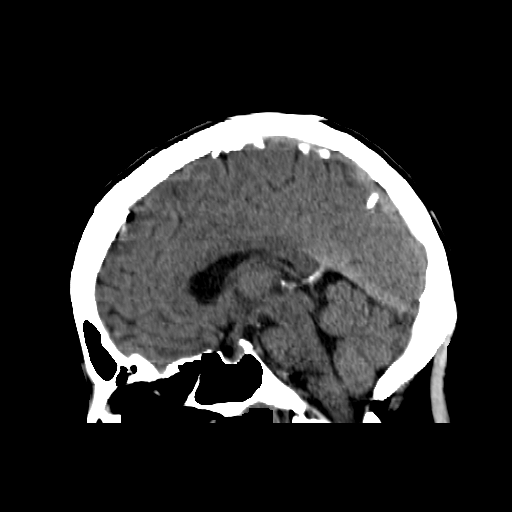
[im 31/47  brain]
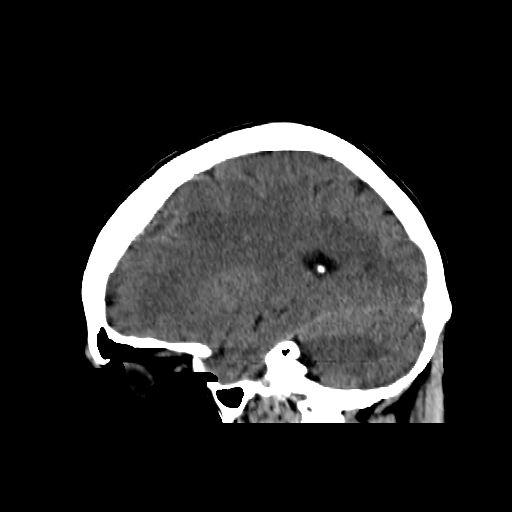

[16 of 47 positions shown; findings below may reference images not displayed]

FINDINGS: There is no evidence of mass effect, midline shift or extra-axial
fluid collections. There is no evidence of a space-occupying lesion
or intracranial hemorrhage. There is no evidence of a cortical-based
area of acute infarction.

The ventricles and sulci are appropriate for the patient's age. The
basal cisterns are patent.

Visualized portions of the orbits are unremarkable. The visualized
portions of the paranasal sinuses and mastoid air cells are
unremarkable.

The osseous structures are unremarkable.
IMPRESSION: No acute intracranial pathology.

## 2016-02-19 IMAGING — CT CT HEAD W/O CM
3 series · 17 of 47 positions shown, 20 images · non-contrast
Comparison: None.

CLINICAL DATA: Facial trauma.  Diaphoretic.  Alert and oriented.

EXAM:
CT HEAD WITHOUT CONTRAST
TECHNIQUE: Contiguous axial images were obtained from the base of the skull
through the vertex without intravenous contrast.

[Series 5: headseq 4.8 h45s · axial · 0.40mm/px · z∈[+1281,+1411]mm · 11 of 33 slices shown, 14 images]
[im 3/33  brain]
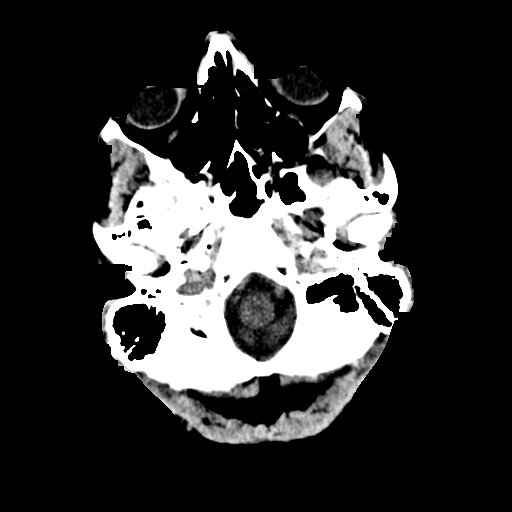
[im 3/33  bone]
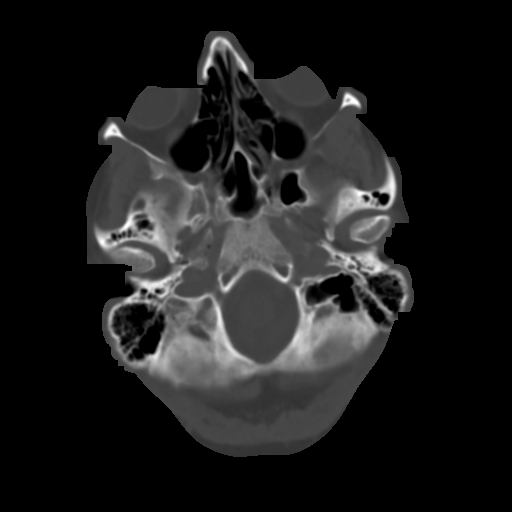
[im 5/33  brain]
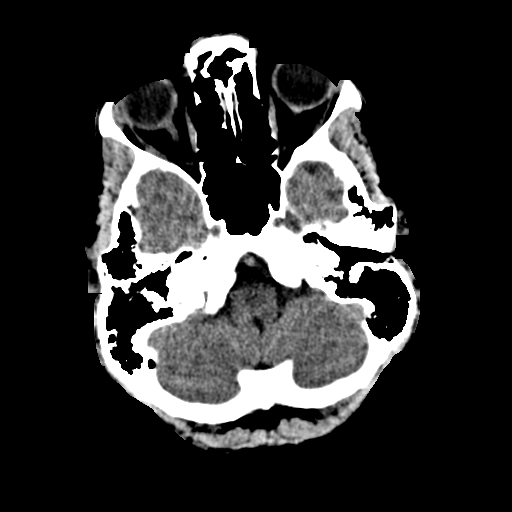
[im 8/33  brain]
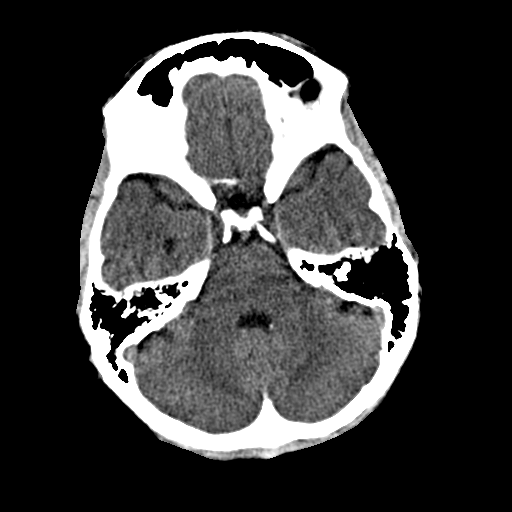
[im 10/33  brain]
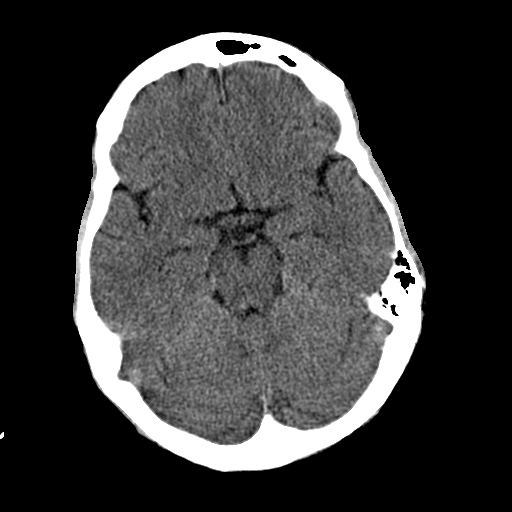
[im 14/33  brain]
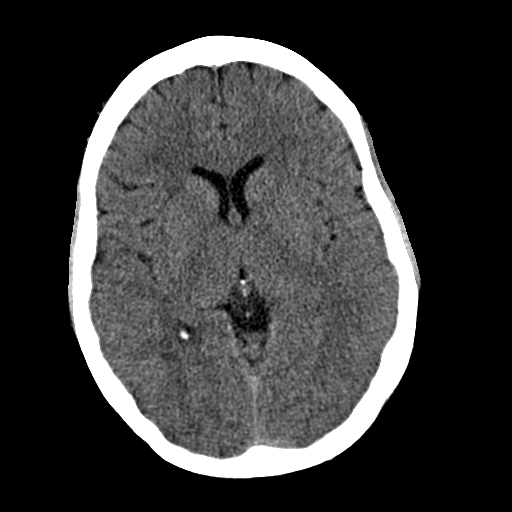
[im 14/33  bone]
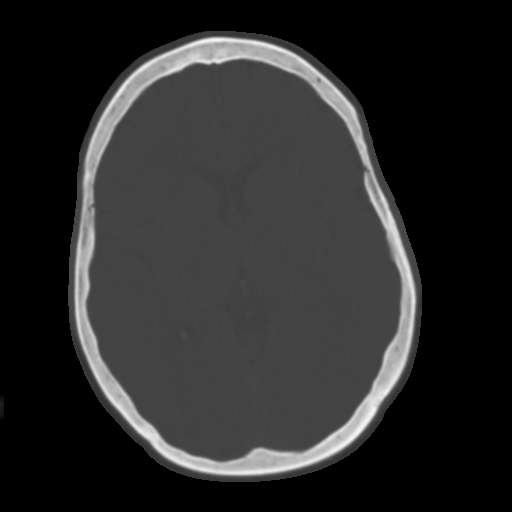
[im 17/33  brain]
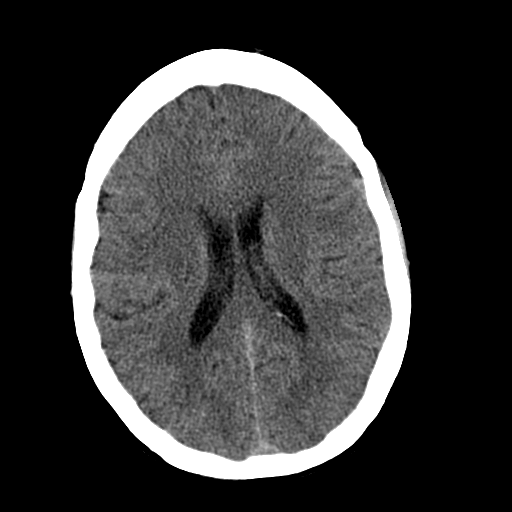
[im 19/33  brain]
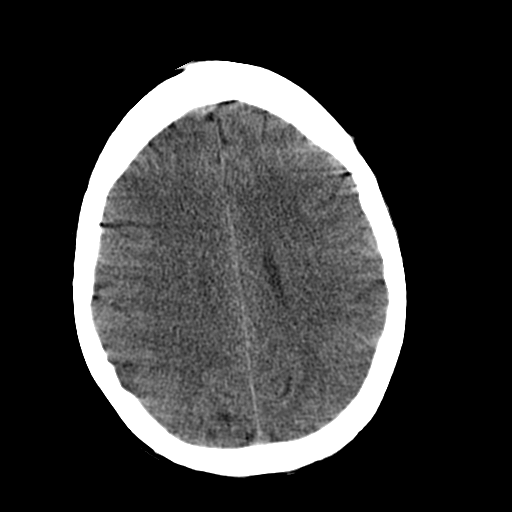
[im 23/33  brain]
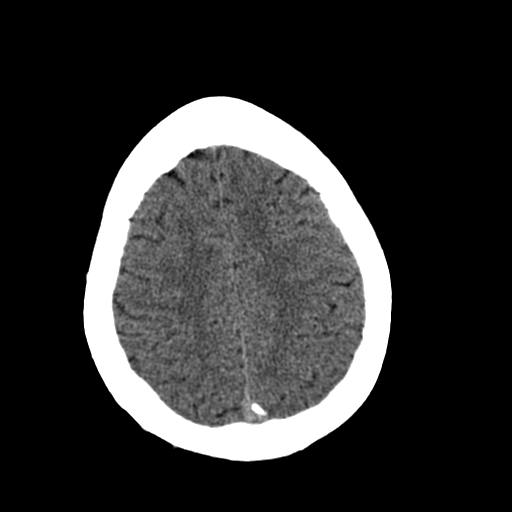
[im 25/33  brain]
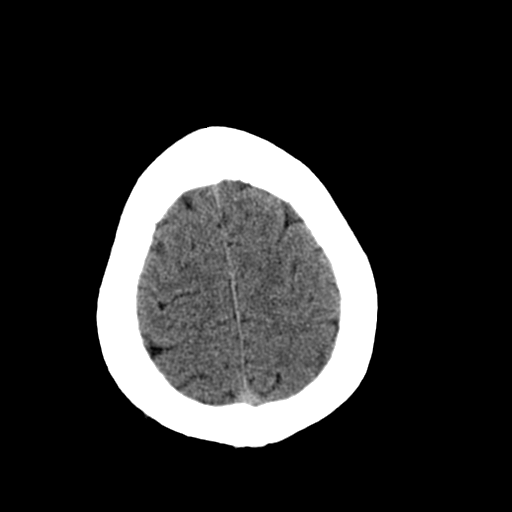
[im 25/33  bone]
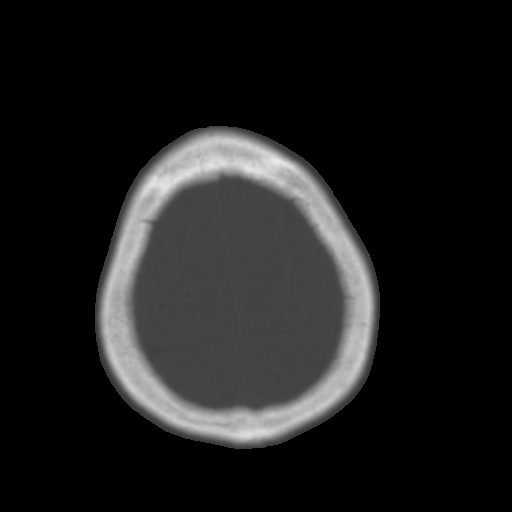
[im 28/33  brain]
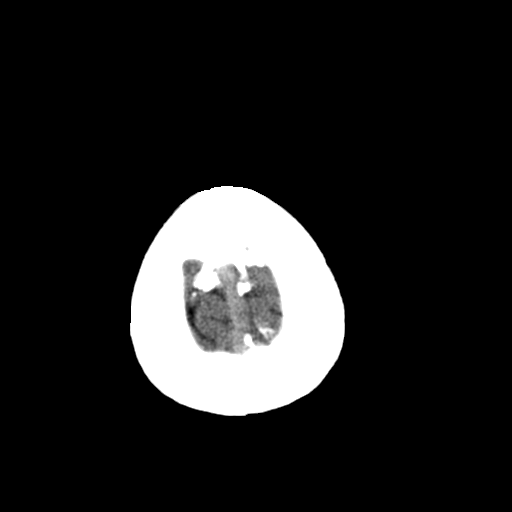
[im 30/33  brain]
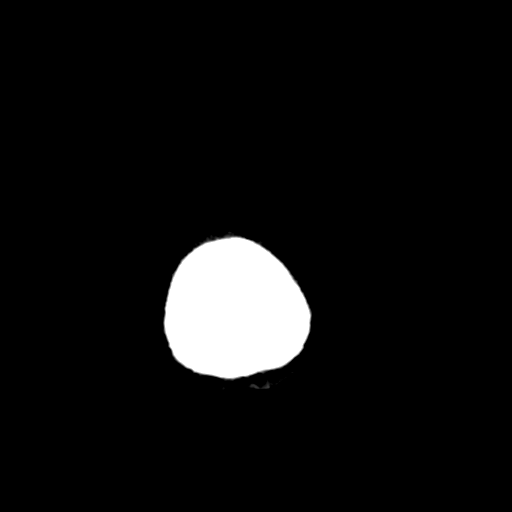

[Series 602: <mpr thick range> · coronal · 0.47mm/px · 3 of 59 slices shown]
[im 20/59  brain]
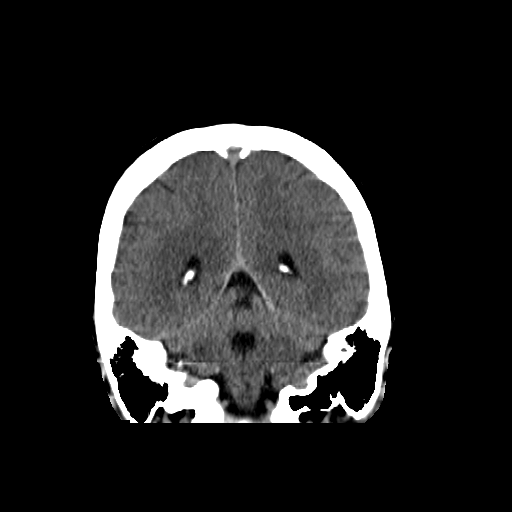
[im 26/59  brain]
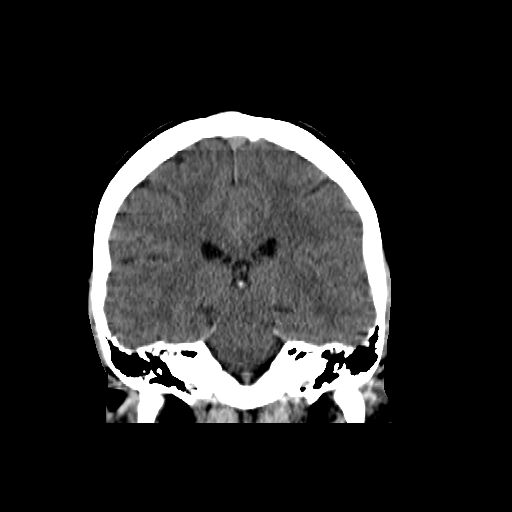
[im 33/59  brain]
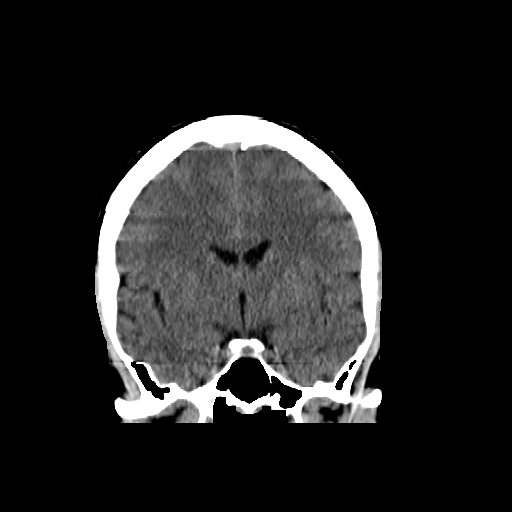

[Series 603: <mpr thick range(1)> · sagittal · 0.47mm/px · 3 of 47 slices shown]
[im 16/47  brain]
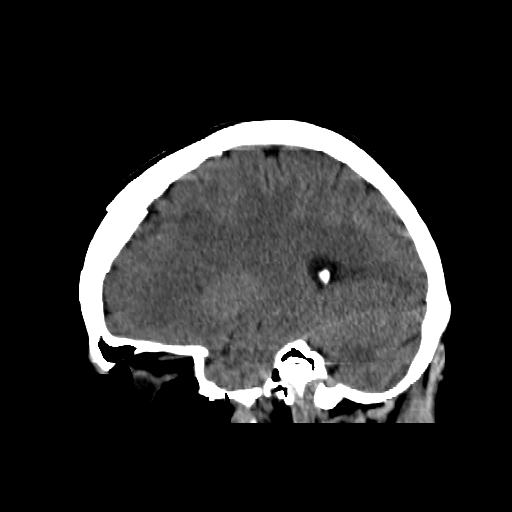
[im 24/47  brain]
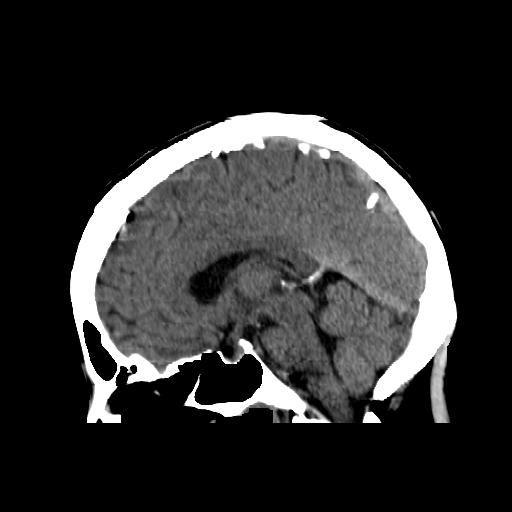
[im 31/47  brain]
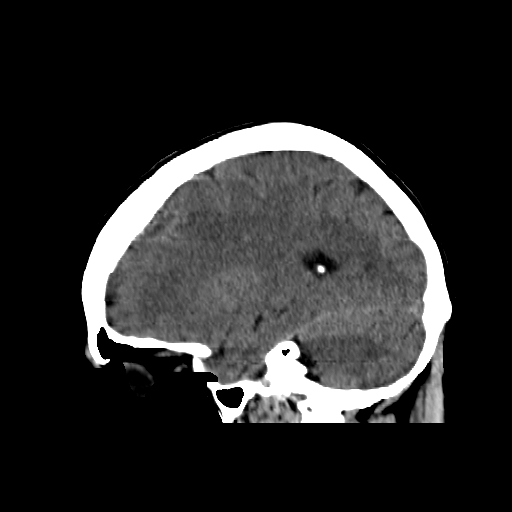

[17 of 47 positions shown; findings below may reference images not displayed]

FINDINGS: There is no evidence of mass effect, midline shift or extra-axial
fluid collections. There is no evidence of a space-occupying lesion
or intracranial hemorrhage. There is no evidence of a cortical-based
area of acute infarction.

The ventricles and sulci are appropriate for the patient's age. The
basal cisterns are patent.

Visualized portions of the orbits are unremarkable. The visualized
portions of the paranasal sinuses and mastoid air cells are
unremarkable.

The osseous structures are unremarkable.
IMPRESSION: No acute intracranial pathology.

## 2019-01-19 ENCOUNTER — Ambulatory Visit: Payer: Self-pay | Admitting: Specialist

## 2019-02-22 ENCOUNTER — Ambulatory Visit: Payer: Self-pay | Admitting: Specialist

## 2019-03-02 ENCOUNTER — Ambulatory Visit: Payer: Self-pay | Admitting: Specialist

## 2019-03-26 ENCOUNTER — Ambulatory Visit: Payer: Self-pay

## 2019-03-26 ENCOUNTER — Ambulatory Visit (INDEPENDENT_AMBULATORY_CARE_PROVIDER_SITE_OTHER): Payer: Medicaid Other | Admitting: Specialist

## 2019-03-26 ENCOUNTER — Encounter: Payer: Self-pay | Admitting: Specialist

## 2019-03-26 VITALS — BP 136/90 | HR 77 | Ht 66.0 in | Wt 232.0 lb

## 2019-03-26 DIAGNOSIS — M25552 Pain in left hip: Secondary | ICD-10-CM

## 2019-03-26 DIAGNOSIS — Z4889 Encounter for other specified surgical aftercare: Secondary | ICD-10-CM

## 2019-03-26 DIAGNOSIS — R29898 Other symptoms and signs involving the musculoskeletal system: Secondary | ICD-10-CM

## 2019-03-26 DIAGNOSIS — M545 Low back pain: Secondary | ICD-10-CM | POA: Diagnosis not present

## 2019-03-26 DIAGNOSIS — G8929 Other chronic pain: Secondary | ICD-10-CM

## 2019-03-26 NOTE — Patient Instructions (Signed)
Avoid frequent bending and stooping  No lifting greater than 10 lbs. May use ice or moist heat for pain. Weight loss is of benefit. Best medication for lumbar disc disease is arthritis medications like motrin, celebrex and naprosyn. Exercise is important to improve your indurance and does allow people to function better inspite of back pain. Use a walker for standing and walking. Weight bear as tolerated left leg. MRI of you left hip and lumbar spine to assess for upper lumbar or lower dorsal HNP that Could be causing you left leg weakness

## 2019-04-19 ENCOUNTER — Ambulatory Visit: Payer: Medicaid Other | Admitting: Specialist

## 2019-04-20 ENCOUNTER — Telehealth: Payer: Self-pay | Admitting: Specialist

## 2019-04-20 NOTE — Telephone Encounter (Signed)
Patient called. Says [facility] denied the MRI. Her call back number is [phone]

## 2019-04-20 NOTE — Telephone Encounter (Signed)
I called and advised patient that the MRI of her Lumbar spine was approved but the one for her Hip was denied. She will call [facility] to get the Lumbar MRI scheduled.

## 2019-04-23 ENCOUNTER — Telehealth: Payer: Self-pay | Admitting: Specialist

## 2019-04-23 NOTE — Telephone Encounter (Signed)
Patient called. Says she has not heard anything about her MRI. She would like a call back. Her number is [phone]

## 2019-04-30 NOTE — Telephone Encounter (Signed)
Can you please check on this?

## 2020-05-15 ENCOUNTER — Telehealth: Payer: Self-pay

## 2020-05-15 NOTE — Telephone Encounter (Signed)
Pt called triage phone. She stated she wanted to see Dr. [name] asap.She stated she has an increase in pain since last time in office  But with no new injury. She also is concerned about possible bladder control loss-she is unsure if this is due to her back or because she drinks a lot of liquids and cant get to the restroom in time. She stated she had recent xrays done that she will get a copy of to bring in. I informed her that I would need to reach out to dr. Rogelia Mire team since he currently doesn't have any openings.  Her best CB # [phone]

## 2020-05-15 NOTE — Telephone Encounter (Signed)
I scheduled for 05/22/20 with Fayrene Fearing @ 1045.  I have put her on the cancellation list for Dr. [name].  I advised that she should go to the ER and be evaluated she states that she understands

## 2020-05-22 ENCOUNTER — Ambulatory Visit (INDEPENDENT_AMBULATORY_CARE_PROVIDER_SITE_OTHER): Payer: Medicaid Other | Admitting: Surgery

## 2020-05-22 ENCOUNTER — Encounter: Payer: Self-pay | Admitting: Surgery

## 2020-05-22 VITALS — BP 155/98 | HR 92 | Ht 66.0 in | Wt 192.0 lb

## 2020-05-22 DIAGNOSIS — M5416 Radiculopathy, lumbar region: Secondary | ICD-10-CM | POA: Diagnosis not present

## 2020-05-22 DIAGNOSIS — Z4889 Encounter for other specified surgical aftercare: Secondary | ICD-10-CM

## 2020-05-22 NOTE — Progress Notes (Signed)
Office Visit Note   Patient: Tammy Li           Date of Birth: Sep 27, 1965           MRN: [id number] Visit Date: 05/22/2020              Requested by: No referring provider defined for this encounter. PCP: Pcp, No   Assessment & Plan: Visit Diagnoses:  1. Radiculopathy, lumbar region   2. Encounter for other specified surgical aftercare     Plan: Advised patient that since she is having the same symptoms that she presented with when she was seen by Dr. [name] August 2020 I will reorder the lumbar MRI.  She will follow up with him after completion of her study to discuss results and further treatment options.  Advised patient that if her pain worsens that she can go to the ER for evaluation.  She will continue prednisone taper that was prescribed by primary care provider.  No medications given today.  Follow-Up Instructions: Return in about 2 weeks (around 06/05/2020) for to review lumbar mri.   Orders:  Orders Placed This Encounter  Procedures  . MR Lumbar Spine w/o contrast   No orders of the defined types were placed in this encounter.     Procedures: No procedures performed   Clinical Data: No additional findings.   Subjective: Chief Complaint  Patient presents with  . Lower Back - Pain    HPI 54 year old white female comes in today with complaints of low back pain, left hip pain and left lower extremity radiculopathy.  Patient states that this is been a chronic problem.  She was last seen by Dr. [name] for same issue March 26, 2019.  At that visit he had ordered MRI lumbar spine along with MRI of the left hip.  It looks like the left hip MRI was denied by her insurance.  Patient had a scheduled appointment to have the lumbar MRI but she did not go.  She stated that she was just hoping that her pain would get better and did not want surgery.  States the pain has been more severe over the last few days.  No injury.  Radiates down from the left side of her back to  the left buttock, left hip and down her to her thigh.  No right leg symptoms.  Primary care provider recently started prednisone taper.  Has not noticed any improvement. Review of Systems No current cardiac pulmonary issues  Objective: Vital Signs: BP (!) 155/98 (BP Location: Left Arm, Patient Position: Sitting, Cuff Size: Large)   Pulse 92   Ht 5\' 6"  (1.676 m)   Wt 192 lb (87.1 kg)   BMI 30.99 kg/m   Physical Exam HENT:     Head: Normocephalic.  Musculoskeletal:     Comments: Positive left straight leg raise.  Positive left lumbar paraspinal tenderness/spasm.  Negative logroll bilateral hips.  Bilateral calves nontender.  Neurological:     General: No focal deficit present.     Mental Status: She is alert and oriented to person, place, and time.     Ortho Exam  Specialty Comments:  No specialty comments available.  Imaging: No results found.   PMFS History: Patient Active Problem List   Diagnosis Date Noted  . HNP (herniated nucleus pulposus), cervical 12/08/2012    Class: Chronic  . Cervical spondylosis without myelopathy 12/08/2012    Class: Chronic   Past Medical History:  Diagnosis Date  . Arthritis   .  Claustrophobia   . CP (cerebral palsy) (HCC)   . GERD (gastroesophageal reflux disease)   . Headache(784.0)   . Neuromuscular disorder (HCC)    cp    History reviewed. No pertinent family history.  Past Surgical History:  Procedure Laterality Date  . BACK SURGERY    . HAND SURGERY Left    cyst  . KNEE ARTHROSCOPY Right   . LEG SURGERY Bilateral    heel cord lengthening  rt and lf plus 2 other   . POSTERIOR CERVICAL FUSION/FORAMINOTOMY N/A 12/08/2012   Procedure: Left C3-4 and C6-7 foraminotomy with excision of HNP;  Surgeon: [name], MD;  Location: [facility] OR;  Service: Orthopedics;  Laterality: N/A;  . TUBAL LIGATION     Social History   Occupational History  . Not on file  Tobacco Use  . Smoking status: Current Every Day Smoker    Packs/day:  0.25    Years: 25.00    Pack years: 6.25    Types: Cigarettes  . Smokeless tobacco: Never Used  Substance and Sexual Activity  . Alcohol use: No  . Drug use: Yes    Types: IV, Cocaine  . Sexual activity: Not on file

## 2020-06-03 ENCOUNTER — Ambulatory Visit
Admission: RE | Admit: 2020-06-03 | Discharge: 2020-06-03 | Disposition: A | Discharge status: Home / Self Care | Payer: Medicaid Other | Source: Ambulatory Visit | Attending: Surgery | Admitting: Surgery

## 2020-06-03 ENCOUNTER — Telehealth: Payer: Self-pay | Admitting: Specialist

## 2020-06-03 DIAGNOSIS — Z4889 Encounter for other specified surgical aftercare: Secondary | ICD-10-CM

## 2020-06-03 IMAGING — MR MR LUMBAR SPINE W/O CM
4 of 5 series · 26 of 48 positions shown · non-contrast
Comparison: MRI lumbar spine [DATE]

CLINICAL DATA: Low back pain with left leg radiculopathy. History
of back surgery.

EXAM:
MRI LUMBAR SPINE WITHOUT CONTRAST
TECHNIQUE: Multiplanar, multisequence MR imaging of the lumbar spine was
performed. No intravenous contrast was administered.

[Series 3: T2 post-contrast · sagittal · 4.0mm · 0.53mm/px · 6 of 15 slices shown]
[im 1/15]
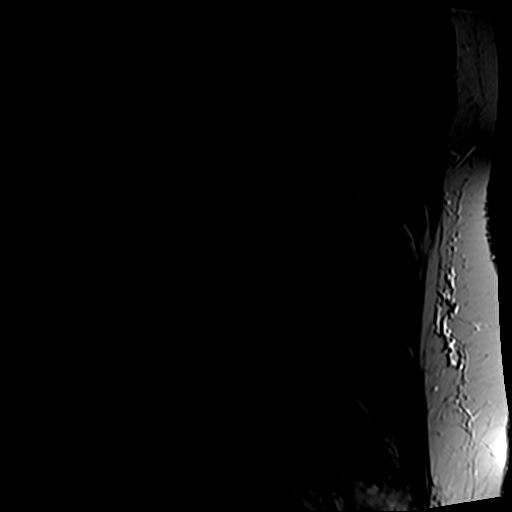
[im 3/15]
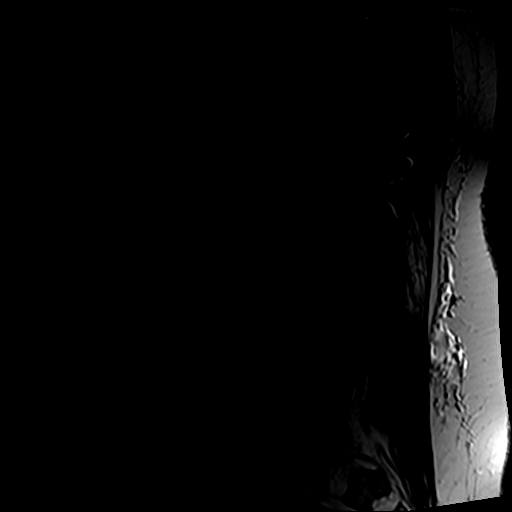
[im 6/15]
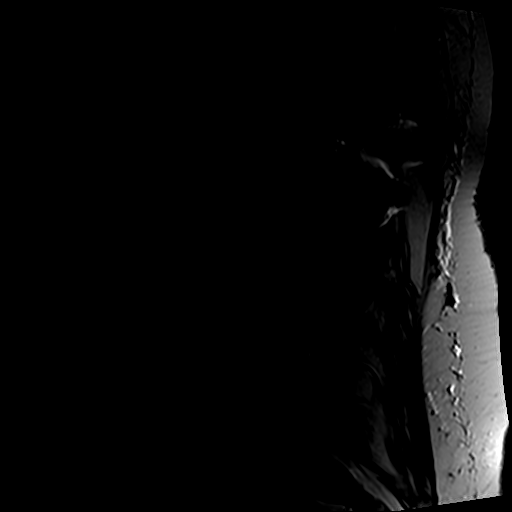
[im 9/15]
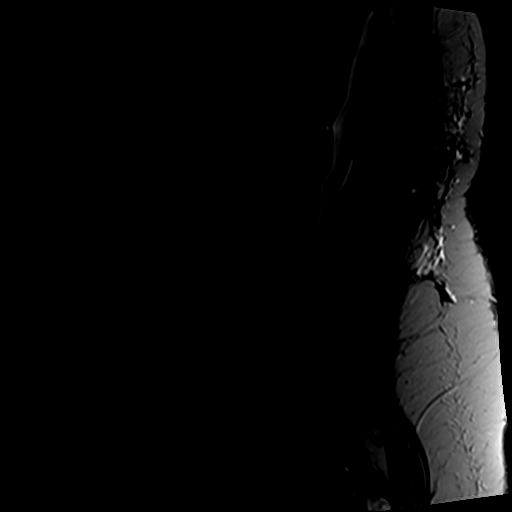
[im 12/15]
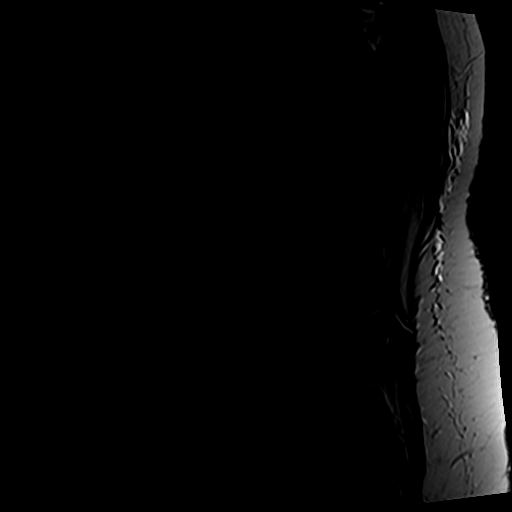
[im 15/15]
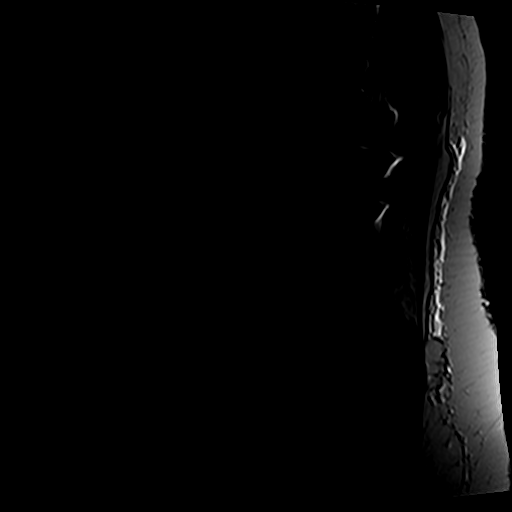

[Series 5: T1 · sagittal · 4.0mm · 0.53mm/px · 6 of 15 slices shown (1 of 2)]
[im 1/15]
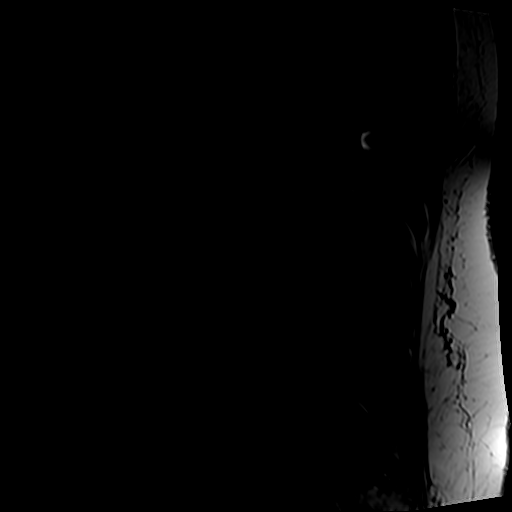
[im 3/15]
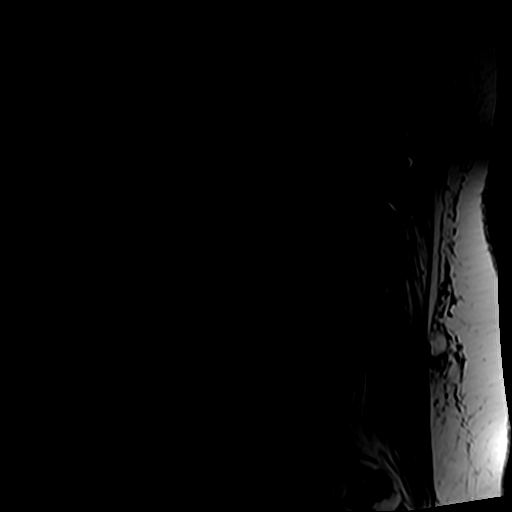
[im 6/15]
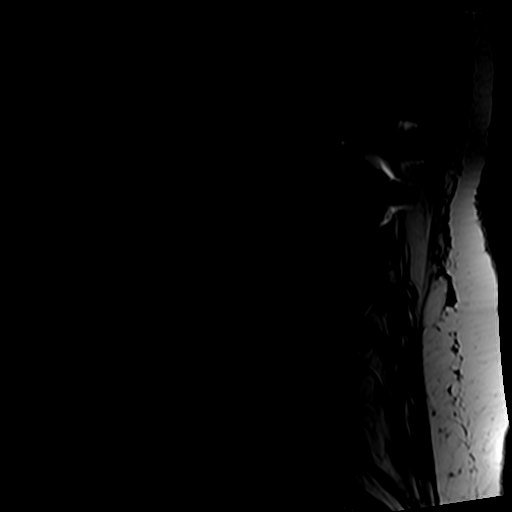
[im 9/15]
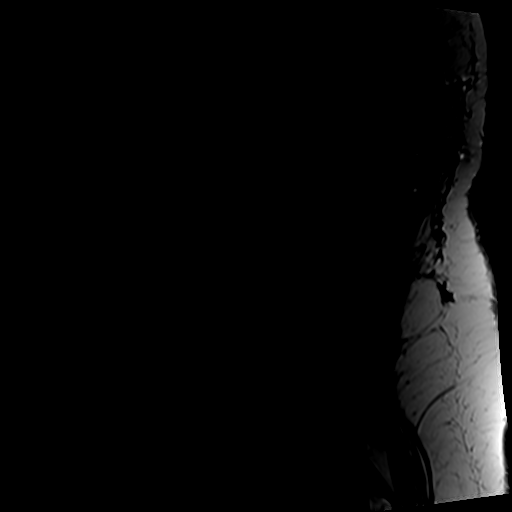
[im 12/15]
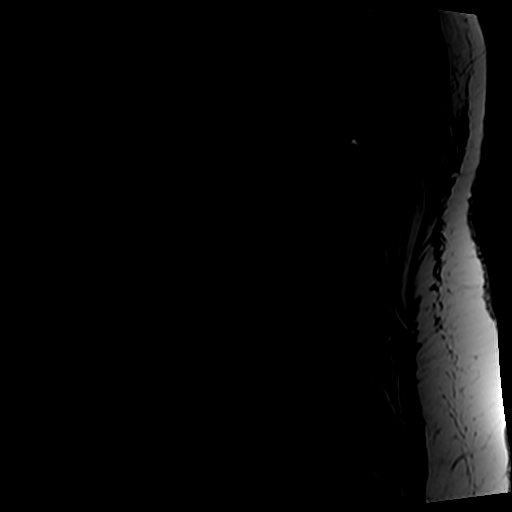
[im 15/15]
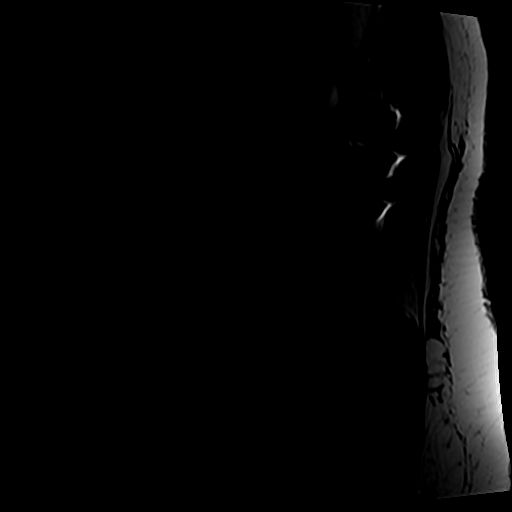

[Series 6: T2 · axial · 4.0mm · 0.70mm/px · z∈[-107,+117]mm · 9 of 39 slices shown]
[im 1/39]
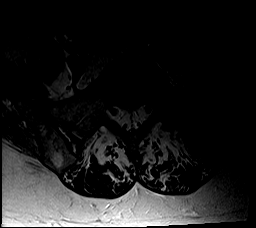
[im 6/39]
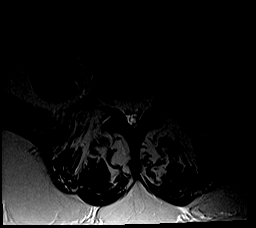
[im 11/39]
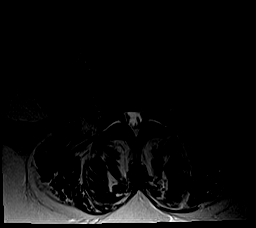
[im 17/39]
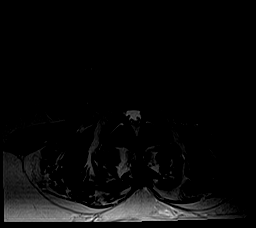
[im 20/39]
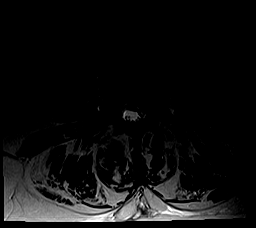
[im 22/39]
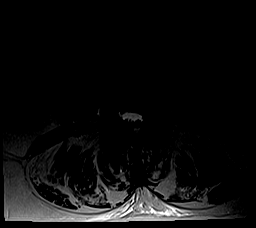
[im 28/39]
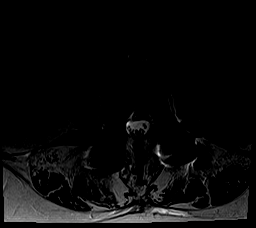
[im 33/39]
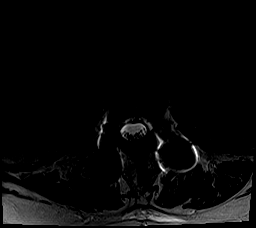
[im 39/39]
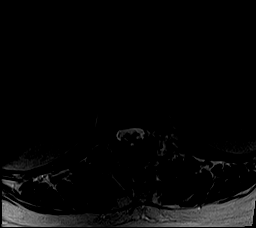

[Series 7: T1 · axial · 4.0mm · 0.35mm/px · z∈[-107,+87]mm · 5 of 39 slices shown (2 of 2)]
[im 1/39]
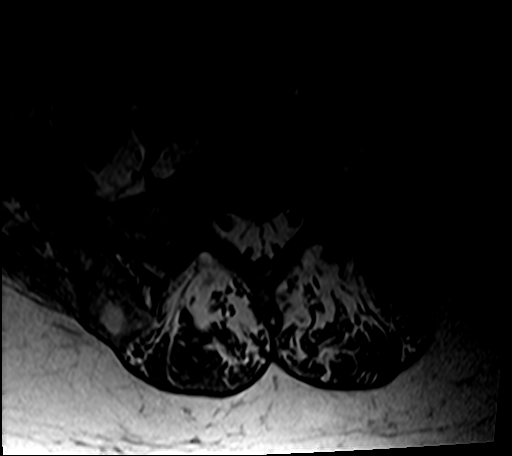
[im 6/39]
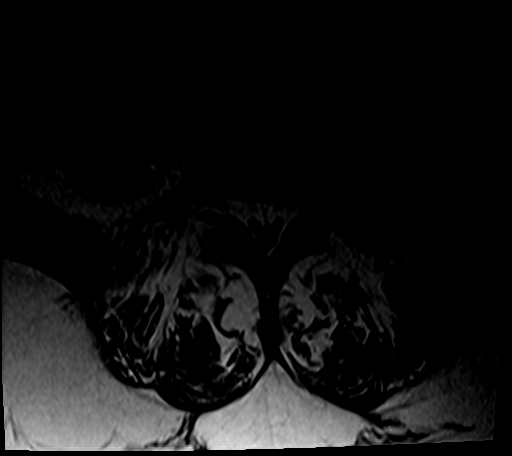
[im 11/39]
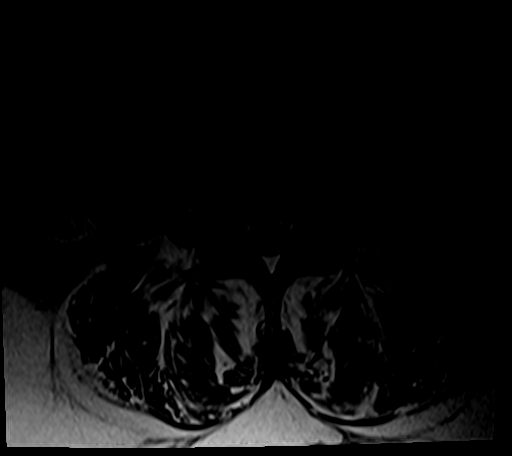
[im 20/39]
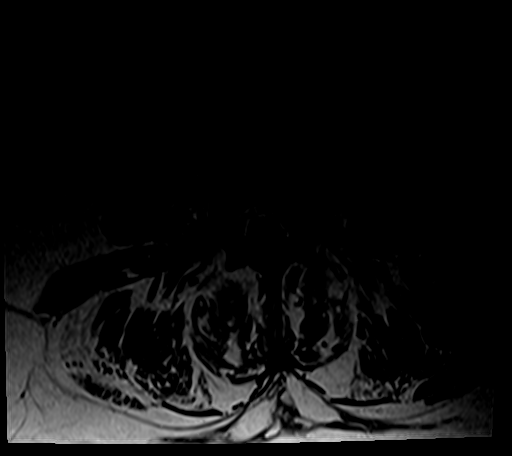
[im 33/39]
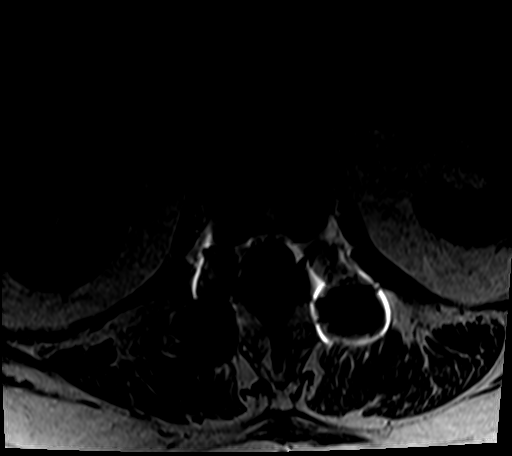

[26 of 48 positions shown; findings below may reference images not displayed]

FINDINGS: Segmentation:  Normal

Alignment:  Mild retrolisthesis L2-3 and L3-4

Vertebrae:  Negative for fracture or mass.

Conus medullaris and cauda equina: Conus extends to the T12-L1
level. Conus and cauda equina appear normal.

Paraspinal and other soft tissues: Negative for paraspinous mass or
adenopathy.

Disc levels:

T12-L1: Mild disc and facet degeneration.  Negative for stenosis.

L1-2: Bilateral pedicle screw fusion with interbody fusion.
Bilateral laminectomy. No significant spinal or foraminal stenosis.

L2-3: Diffuse bulging of the disc. Bilateral facet hypertrophy. Mild
subarticular stenosis bilaterally left greater than right. Extruded
disc fragment behind the L3 vertebral body on the left appears to be
arising from the L2-3 disc space extending caudally. There is left
L3 nerve root impingement.

L3-4: Mild disc degeneration and disc bulging. Bilateral facet
hypertrophy. Spinal canal not significantly narrowed.

L4-5: Diffuse bulging of the disc with mild endplate spurring.
Bilateral facet and ligamentum flavum hypertrophy. Moderate
subarticular stenosis bilaterally. Spinal canal adequate in size.

L5-S1: Mild facet degeneration.  Negative for stenosis.
IMPRESSION: Pedicle screw and interbody fusion L1-2 without stenosis

Extruded disc fragment behind the L3 vertebral body causing left L3
nerve root impingement. Disc fragment likely arise from the L2-3
disc.

## 2020-06-03 NOTE — Telephone Encounter (Signed)
I called and advised patient that I do not have have anything before 11/24 but I did put her there so that at least she would have an appt. But that I also put her on the cancellation list to call if something opened up sooner.  She states that she under stands.

## 2020-06-16 ENCOUNTER — Encounter: Payer: Self-pay | Admitting: Specialist

## 2020-06-16 ENCOUNTER — Ambulatory Visit (INDEPENDENT_AMBULATORY_CARE_PROVIDER_SITE_OTHER): Payer: Medicaid Other | Admitting: Specialist

## 2020-06-16 VITALS — BP 126/89 | HR 76 | Ht 66.0 in | Wt 192.0 lb

## 2020-06-16 DIAGNOSIS — M5416 Radiculopathy, lumbar region: Secondary | ICD-10-CM | POA: Diagnosis not present

## 2020-06-16 DIAGNOSIS — R29898 Other symptoms and signs involving the musculoskeletal system: Secondary | ICD-10-CM

## 2020-06-16 DIAGNOSIS — Z981 Arthrodesis status: Secondary | ICD-10-CM

## 2020-06-16 DIAGNOSIS — M5116 Intervertebral disc disorders with radiculopathy, lumbar region: Secondary | ICD-10-CM

## 2020-06-16 NOTE — Progress Notes (Signed)
Office Visit Note   Patient: Tammy Li           Date of Birth: Feb 20, 1966           MRN: [id number] Visit Date: 06/16/2020              Requested by: No referring provider defined for this encounter. PCP: Pcp, No   Assessment & Plan: Visit Diagnoses:  1. Radiculopathy, lumbar region   2. Herniation of lumbar intervertebral disc with radiculopathy   3. History of lumbar spinal fusion   4. Proximal leg weakness     Plan: Avoid bending, stooping and avoid lifting weights greater than 10 lbs. Avoid prolong standing and walking. Order for a new walker with wheels. Surgery scheduling secretary Tivis Ringer, will call you in the next week to schedule for surgery.  Surgery recommended is a one level left lumbar hemilaminectomy with microdiscectomy left L3-4 this would be done with OR microscope Take hydrocodone for for pain. Risk of surgery includes risk of infection 1 in 300 patients, bleeding .25 chance you would need a transfusion.   Risk to the nerves is one in 10,000.  Expect improved walking and standing tolerance. Expect relief of leg pain but numbness may persist depending on the length and degree of pressure that has been present.  Follow-Up Instructions: Return in about 4 weeks (around 07/14/2020).   Orders:  No orders of the defined types were placed in this encounter.  No orders of the defined types were placed in this encounter.     Procedures: No procedures performed   Clinical Data: Findings:  Narrative & Impression CLINICAL DATA:  Low back pain with left leg radiculopathy. History of back surgery.  EXAM: MRI LUMBAR SPINE WITHOUT CONTRAST  TECHNIQUE: Multiplanar, multisequence MR imaging of the lumbar spine was performed. No intravenous contrast was administered.  COMPARISON:  MRI lumbar spine 03/01/2007  FINDINGS: Segmentation:  Normal  Alignment:  Mild retrolisthesis L2-3 and L3-4  Vertebrae:  Negative for fracture or  mass.  Conus medullaris and cauda equina: Conus extends to the T12-L1 level. Conus and cauda equina appear normal.  Paraspinal and other soft tissues: Negative for paraspinous mass or adenopathy.  Disc levels:  T12-L1: Mild disc and facet degeneration.  Negative for stenosis.  L1-2: Bilateral pedicle screw fusion with interbody fusion. Bilateral laminectomy. No significant spinal or foraminal stenosis.  L2-3: Diffuse bulging of the disc. Bilateral facet hypertrophy. Mild subarticular stenosis bilaterally left greater than right. Extruded disc fragment behind the L3 vertebral body on the left appears to be arising from the L2-3 disc space extending caudally. There is left L3 nerve root impingement.  L3-4: Mild disc degeneration and disc bulging. Bilateral facet hypertrophy. Spinal canal not significantly narrowed.  L4-5: Diffuse bulging of the disc with mild endplate spurring. Bilateral facet and ligamentum flavum hypertrophy. Moderate subarticular stenosis bilaterally. Spinal canal adequate in size.  L5-S1: Mild facet degeneration.  Negative for stenosis.  IMPRESSION: Pedicle screw and interbody fusion L1-2 without stenosis  Extruded disc fragment behind the L3 vertebral body causing left L3 nerve root impingement. Disc fragment likely arise from the L2-3 disc.   Electronically Signed   By: Marlan Palau M.D.   On: 06/03/2020 13:07       Subjective: Chief Complaint  Patient presents with  . Lower Back - Follow-up    MRI Review    54  Year old female with history of cervical foraminotomies in the past and also lumbar decompression and  fusion at the L1-2 level for DDD and spondylosis with spondylolisthesis.  She had pain into the left thigh and left knee for about 4-6 months and presented to the ER 10/17 and there she had injections of the back and underwent MRI of the lumbar spine 10/26 At [facility]. She has pain with prolong standing and  with walking pain into the left thigh and leg. No bowel or bladder difficulty. She is on incontinent meds at night due to bladder Weekness.    Review of Systems  Constitutional: Negative.   HENT: Negative.   Eyes: Negative.   Respiratory: Negative.   Cardiovascular: Negative.   Gastrointestinal: Negative.   Endocrine: Negative.   Genitourinary: Negative.   Musculoskeletal: Negative.   Skin: Negative.   Allergic/Immunologic: Negative.   Neurological: Negative.   Hematological: Negative.   Psychiatric/Behavioral: Negative.      Objective: Vital Signs: BP 126/89 (BP Location: Left Arm, Patient Position: Sitting)   Pulse 76   Ht 5\' 6"  (1.676 m)   Wt 192 lb (87.1 kg)   BMI 30.99 kg/m   Physical Exam Constitutional:      Appearance: She is well-developed.  HENT:     Head: Normocephalic and atraumatic.  Eyes:     Pupils: Pupils are equal, round, and reactive to light.  Pulmonary:     Effort: Pulmonary effort is normal.     Breath sounds: Normal breath sounds.  Abdominal:     General: Bowel sounds are normal.     Palpations: Abdomen is soft.  Musculoskeletal:        General: Normal range of motion.     Cervical back: Normal range of motion and neck supple.  Skin:    General: Skin is warm and dry.  Neurological:     Mental Status: She is alert and oriented to person, place, and time.  Psychiatric:        Behavior: Behavior normal.        Thought Content: Thought content normal.        Judgment: Judgment normal.     Ortho Exam  Specialty Comments:  No specialty comments available.  Imaging: No results found.   PMFS History: Patient Active Problem List   Diagnosis Date Noted  . HNP (herniated nucleus pulposus), cervical 12/08/2012    Priority: High    Class: Chronic  . Cervical spondylosis without myelopathy 12/08/2012    Priority: High    Class: Chronic   Past Medical History:  Diagnosis Date  . Arthritis   . Claustrophobia   . CP (cerebral palsy)  (HCC)   . GERD (gastroesophageal reflux disease)   . Headache(784.0)   . Neuromuscular disorder (HCC)    cp    History reviewed. No pertinent family history.  Past Surgical History:  Procedure Laterality Date  . BACK SURGERY    . HAND SURGERY Left    cyst  . KNEE ARTHROSCOPY Right   . LEG SURGERY Bilateral    heel cord lengthening  rt and lf plus 2 other   . POSTERIOR CERVICAL FUSION/FORAMINOTOMY N/A 12/08/2012   Procedure: Left C3-4 and C6-7 foraminotomy with excision of HNP;  Surgeon: 02/07/2013, MD;  Location: [facility] OR;  Service: Orthopedics;  Laterality: N/A;  . TUBAL LIGATION     Social History   Occupational History  . Not on file  Tobacco Use  . Smoking status: Current Every Day Smoker    Packs/day: 0.25    Years: 25.00    Pack  years: 6.25    Types: Cigarettes  . Smokeless tobacco: Never Used  Substance and Sexual Activity  . Alcohol use: No  . Drug use: Yes    Types: IV, Cocaine  . Sexual activity: Not on file

## 2020-06-16 NOTE — Patient Instructions (Addendum)
Avoid bending, stooping and avoid lifting weights greater than 10 lbs. Avoid prolong standing and walking. Order for a new walker with wheels. Surgery scheduling secretary Tivis Ringer, will call you in the next week to schedule for surgery.  Surgery recommended is a one level left lumbar hemilaminectomy with microdiscectomy left L3-4 this would be done with OR microscope Take hydrocodone for for pain. Risk of surgery includes risk of infection 1 in 300 patients, bleeding .25 chance you would need a transfusion.   Risk to the nerves is one in 10,000.  Expect improved walking and standing tolerance. Expect relief of leg pain but numbness may persist depending on the length and degree of pressure that has been present.

## 2020-07-02 ENCOUNTER — Encounter: Payer: Self-pay | Admitting: Surgery

## 2020-07-02 ENCOUNTER — Ambulatory Visit: Payer: Medicaid Other | Admitting: Specialist

## 2020-07-02 ENCOUNTER — Other Ambulatory Visit: Payer: Self-pay

## 2020-07-02 ENCOUNTER — Ambulatory Visit (INDEPENDENT_AMBULATORY_CARE_PROVIDER_SITE_OTHER): Payer: Medicaid Other | Admitting: Surgery

## 2020-07-02 VITALS — BP 162/128 | HR 93 | Ht 66.0 in | Wt 225.8 lb

## 2020-07-02 DIAGNOSIS — M5416 Radiculopathy, lumbar region: Secondary | ICD-10-CM

## 2020-07-02 NOTE — H&P (View-Only) (Signed)
54-year-old white female history of left L2-3 HNP low back pain and left lower extremity radiculopathy comes in for preop evaluation.  States that symptoms unchanged from previous visit and she is wanting to proceed with left L3-4 hemilaminectomy with left L2-3 and L3-4 excision of extruded disc herniation.  Today history and physical performed.  Review of systems negative.  Surgical procedure discussed.  All questions answered. 

## 2020-07-02 NOTE — Progress Notes (Signed)
54 year old white female history of left L2-3 HNP low back pain and left lower extremity radiculopathy comes in for preop evaluation.  States that symptoms unchanged from previous visit and she is wanting to proceed with left L3-4 hemilaminectomy with left L2-3 and L3-4 excision of extruded disc herniation.  Today history and physical performed.  Review of systems negative.  Surgical procedure discussed.  All questions answered.

## 2020-07-08 NOTE — Progress Notes (Signed)
[facility] [address] [facility] [address] [facility] - [place], [place] - [address] [place] [address] [place] [address] Phone: [phone] Fax: [phone]      Your procedure is scheduled on 07/11/2020.  Report to [facility] Main Entrance "A" at 10:30 A.M., and check in at the Admitting office.  Call this number if you have problems the morning of surgery:  [phone]  Call [phone] if you have any questions prior to your surgery date Monday-Friday 8am-4pm    Remember:  Do not eat after midnight the night before your surgery  You may drink clear liquids until 09:30am the morning of your surgery.   Clear liquids allowed are: Water, Non-Citrus Juices (without pulp), Carbonated Beverages, Clear Tea, Black Coffee Only, and Gatorade  Patient Instructions   The night before surgery:  o No food after midnight. ONLY clear liquids after midnight   The day of surgery (if you do NOT have diabetes):  o Drink ONE (1) Pre-Surgery Clear Ensure before 09:30am. Drink in one sitting and do not sip.  o This drink was given to you during your hospital  pre-op appointment visit. o Nothing else to drink after completing the  Pre-Surgery Clear Ensure.         If you have questions, please contact your surgeons office.     Take these medicines the morning of surgery with A SIP OF WATER  atorvastatin (LIPITOR) dicyclomine (BENTYL)  loratadine (CLARITIN)               Take these medications if needed the morning of surgery:  diazepam (VALIUM) fluticasone (FLONASE)  methocarbamol (ROBAXIN) omeprazole (PRILOSEC)  oxyCODONE (ROXICODONE) PROAIR HFA - bring this inhaler with you the day of surgery.    As of today, STOP taking any Aspirin (unless otherwise instructed by your surgeon) Aleve, Naproxen, Ibuprofen, Motrin, Advil, Goody's, BC's, all herbal medications, fish oil, and all vitamins.                      Do not wear jewelry, make up, or nail polish            Do not wear lotions, powders,  perfumes/colognes, or deodorant.            Do not shave 48 hours prior to surgery.              Do not bring valuables to the hospital.            [facility] is not responsible for any belongings or valuables.  Do NOT Smoke (Tobacco/Vaping) or drink Alcohol 24 hours prior to your procedure If you use a CPAP at night, you may bring all equipment for your overnight stay.   Contacts, glasses, dentures or bridgework may not be worn into surgery.      For patients admitted to the hospital, discharge time will be determined by your treatment team.   Patients discharged the day of surgery will not be allowed to drive home, and someone needs to stay with them for 24 hours.    Special instructions:   - Preparing For Surgery  Before surgery, you can play an important role. Because skin is not sterile, your skin needs to be as free of germs as possible. You can reduce the number of germs on your skin by washing with CHG (chlorahexidine gluconate) Soap before surgery.  CHG is an antiseptic cleaner which kills germs and bonds with the skin to continue killing germs even  after washing.    Oral Hygiene is also important to reduce your risk of infection.  Remember - BRUSH YOUR TEETH THE MORNING OF SURGERY WITH YOUR REGULAR TOOTHPASTE  Please do not use if you have an allergy to CHG or antibacterial soaps. If your skin becomes reddened/irritated stop using the CHG.  Do not shave (including legs and underarms) for at least 48 hours prior to first CHG shower. It is OK to shave your face.  Please follow these instructions carefully.   1. Shower the NIGHT BEFORE SURGERY and the MORNING OF SURGERY with CHG Soap.   2. If you chose to wash your hair, wash your hair first as usual with your normal shampoo.  3. After you shampoo, rinse your hair and body thoroughly to remove the shampoo.  4. Use CHG as you would any other liquid soap. You can apply CHG directly to the skin and wash gently with a  scrungie or a clean washcloth.   5. Apply the CHG Soap to your body ONLY FROM THE NECK DOWN.  Do not use on open wounds or open sores. Avoid contact with your eyes, ears, mouth and genitals (private parts). Wash Face and genitals (private parts)  with your normal soap.   6. Wash thoroughly, paying special attention to the area where your surgery will be performed.  7. Thoroughly rinse your body with warm water from the neck down.  8. DO NOT shower/wash with your normal soap after using and rinsing off the CHG Soap.  9. Pat yourself dry with a CLEAN TOWEL.  10. Wear CLEAN PAJAMAS to bed the night before surgery  11. Place CLEAN SHEETS on your bed the night of your first shower and DO NOT SLEEP WITH PETS.   Day of Surgery: SHOWER Wear Clean/Comfortable clothing the morning of surgery Do not apply any deodorants/lotions.   Remember to brush your teeth WITH YOUR REGULAR TOOTHPASTE.   Please read over the following fact sheets that you were given.

## 2020-07-09 ENCOUNTER — Other Ambulatory Visit: Payer: Self-pay

## 2020-07-09 ENCOUNTER — Encounter (HOSPITAL_COMMUNITY)
Admission: RE | Admit: 2020-07-09 | Discharge: 2020-07-09 | Disposition: A | Discharge status: Home / Self Care | Payer: Medicaid Other | Source: Ambulatory Visit | Attending: Specialist | Admitting: Specialist

## 2020-07-09 ENCOUNTER — Other Ambulatory Visit (HOSPITAL_COMMUNITY)
Admission: RE | Admit: 2020-07-09 | Discharge: 2020-07-09 | Disposition: A | Discharge status: Home / Self Care | Payer: Medicaid Other | Source: Ambulatory Visit | Attending: Specialist | Admitting: Specialist

## 2020-07-09 ENCOUNTER — Encounter (HOSPITAL_COMMUNITY): Payer: Self-pay

## 2020-07-09 DIAGNOSIS — Z01818 Encounter for other preprocedural examination: Secondary | ICD-10-CM | POA: Insufficient documentation

## 2020-07-09 DIAGNOSIS — Z20822 Contact with and (suspected) exposure to covid-19: Secondary | ICD-10-CM | POA: Diagnosis not present

## 2020-07-09 DIAGNOSIS — Z01812 Encounter for preprocedural laboratory examination: Secondary | ICD-10-CM | POA: Insufficient documentation

## 2020-07-09 HISTORY — DX: Other seasonal allergic rhinitis: J30.2

## 2020-07-09 HISTORY — DX: Dyspnea, unspecified: R06.00

## 2020-07-09 HISTORY — DX: Family history of other specified conditions: Z84.89

## 2020-07-09 HISTORY — DX: Cerebral palsy, unspecified: G80.9

## 2020-07-09 LAB — PROTIME-INR
INR: 1 (ref 0.8–1.2)
Prothrombin Time: 13 seconds (ref 11.4–15.2)

## 2020-07-09 LAB — URINALYSIS, ROUTINE W REFLEX MICROSCOPIC
Bilirubin Urine: NEGATIVE
Glucose, UA: NEGATIVE mg/dL
Hgb urine dipstick: NEGATIVE
Ketones, ur: NEGATIVE mg/dL
Nitrite: NEGATIVE
Protein, ur: NEGATIVE mg/dL
Specific Gravity, Urine: 1.03 (ref 1.005–1.030)
pH: 5 (ref 5.0–8.0)

## 2020-07-09 LAB — COMPREHENSIVE METABOLIC PANEL
ALT: 18 U/L (ref 0–44)
AST: 20 U/L (ref 15–41)
Albumin: 3.5 g/dL (ref 3.5–5.0)
Alkaline Phosphatase: 56 U/L (ref 38–126)
Anion gap: 11 (ref 5–15)
BUN: 10 mg/dL (ref 6–20)
CO2: 27 mmol/L (ref 22–32)
Calcium: 9.4 mg/dL (ref 8.9–10.3)
Chloride: 105 mmol/L (ref 98–111)
Creatinine, Ser: 1.17 mg/dL — ABNORMAL HIGH (ref 0.44–1.00)
GFR, Estimated: 55 mL/min — ABNORMAL LOW (ref >60)
Glucose, Bld: 92 mg/dL (ref 70–99)
Potassium: 3.7 mmol/L (ref 3.5–5.1)
Sodium: 143 mmol/L (ref 135–145)
Total Bilirubin: 0.5 mg/dL (ref 0.3–1.2)
Total Protein: 7.3 g/dL (ref 6.5–8.1)

## 2020-07-09 LAB — CBC
HCT: 47 % — ABNORMAL HIGH (ref 36.0–46.0)
Hemoglobin: 14.9 g/dL (ref 12.0–15.0)
MCH: 28.6 pg (ref 26.0–34.0)
MCHC: 31.7 g/dL (ref 30.0–36.0)
MCV: 90.2 fL (ref 80.0–100.0)
Platelets: 301 10*3/uL (ref 150–400)
RBC: 5.21 MIL/uL — ABNORMAL HIGH (ref 3.87–5.11)
RDW: 13.2 % (ref 11.5–15.5)
WBC: 9.5 10*3/uL (ref 4.0–10.5)
nRBC: 0 % (ref 0.0–0.2)

## 2020-07-09 LAB — APTT: aPTT: 34 seconds (ref 24–36)

## 2020-07-09 LAB — SURGICAL PCR SCREEN
MRSA, PCR: NEGATIVE
Staphylococcus aureus: NEGATIVE

## 2020-07-09 LAB — SARS CORONAVIRUS 2 (TAT 6-24 HRS): SARS Coronavirus 2: NEGATIVE

## 2020-07-09 NOTE — Progress Notes (Signed)
PCP - [facility]) Cardiologist - denies  PPM/ICD - denies  Chest x-ray - n/a EKG - 07/09/2020 Stress Test - denies ECHO - denies Cardiac Cath - denies  Sleep Study - denies  Patient instructed to hold all Aspirin, NSAID's, herbal medications, fish oil and vitamins 7 days prior to surgery.   ERAS Protcol -yes PRE-SURGERY Ensure or G2- ensure and instructions given  COVID TEST- 07/09/2020   Anesthesia review: no  Patient denies shortness of breath, fever, cough and chest pain at PAT appointment   All instructions explained to the patient, with a verbal understanding of the material. Patient agrees to go over the instructions while at home for a better understanding. Patient also instructed to self quarantine after being tested for COVID-19. The opportunity to ask questions was provided.

## 2020-07-09 NOTE — Progress Notes (Signed)
Dr Rogelia Mire office called and message left with surgery scheduler and advised of small amount of leukocytes and rare amount of bacteria noted in U/A results.

## 2020-07-11 ENCOUNTER — Ambulatory Visit (HOSPITAL_COMMUNITY)
Admission: RE | Disposition: A | Discharge status: Home / Self Care | Payer: Self-pay | Source: Home / Self Care | Attending: Specialist

## 2020-07-11 ENCOUNTER — Ambulatory Visit (HOSPITAL_COMMUNITY): Payer: Medicaid Other | Admitting: Anesthesiology

## 2020-07-11 ENCOUNTER — Ambulatory Visit (HOSPITAL_COMMUNITY): Payer: Medicaid Other

## 2020-07-11 ENCOUNTER — Encounter (HOSPITAL_COMMUNITY): Payer: Self-pay | Admitting: Specialist

## 2020-07-11 ENCOUNTER — Observation Stay (HOSPITAL_COMMUNITY)
Admission: RE | Admit: 2020-07-11 | Discharge: 2020-07-13 | Disposition: A | Discharge status: Home / Self Care | Payer: Medicaid Other | Attending: Specialist | Admitting: Specialist

## 2020-07-11 ENCOUNTER — Other Ambulatory Visit: Payer: Self-pay

## 2020-07-11 DIAGNOSIS — Z419 Encounter for procedure for purposes other than remedying health state, unspecified: Secondary | ICD-10-CM

## 2020-07-11 DIAGNOSIS — M5116 Intervertebral disc disorders with radiculopathy, lumbar region: Secondary | ICD-10-CM

## 2020-07-11 DIAGNOSIS — F17210 Nicotine dependence, cigarettes, uncomplicated: Secondary | ICD-10-CM | POA: Insufficient documentation

## 2020-07-11 DIAGNOSIS — M545 Low back pain, unspecified: Secondary | ICD-10-CM | POA: Diagnosis present

## 2020-07-11 DIAGNOSIS — Z9889 Other specified postprocedural states: Secondary | ICD-10-CM

## 2020-07-11 HISTORY — PX: LUMBAR LAMINECTOMY/DECOMPRESSION MICRODISCECTOMY: SHX5026

## 2020-07-11 LAB — POCT PREGNANCY, URINE: Preg Test, Ur: NEGATIVE

## 2020-07-11 IMAGING — DX DG LUMBAR SPINE 2-3V
2 series · 2 of 2 positions shown · non-contrast
Comparison: [DATE]

CLINICAL DATA: Intraoperative evaluation, L3-4 hemilaminectomy,
left L2 and L3-4 extruded disc excision

EXAM:
LUMBAR SPINE - 2-3 VIEW

[l-spine lat (1 of 2)]
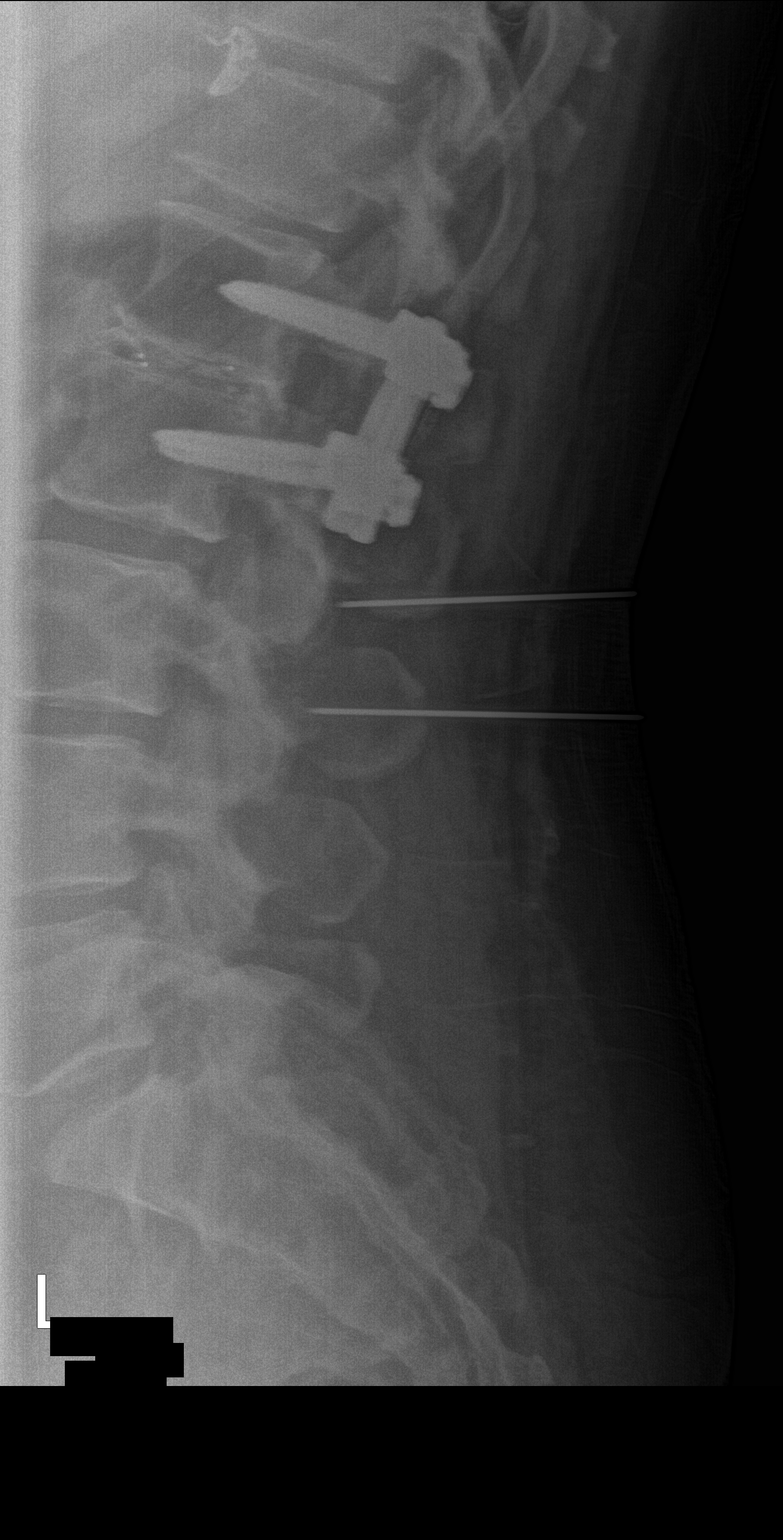

[l-spine lat (2 of 2)]
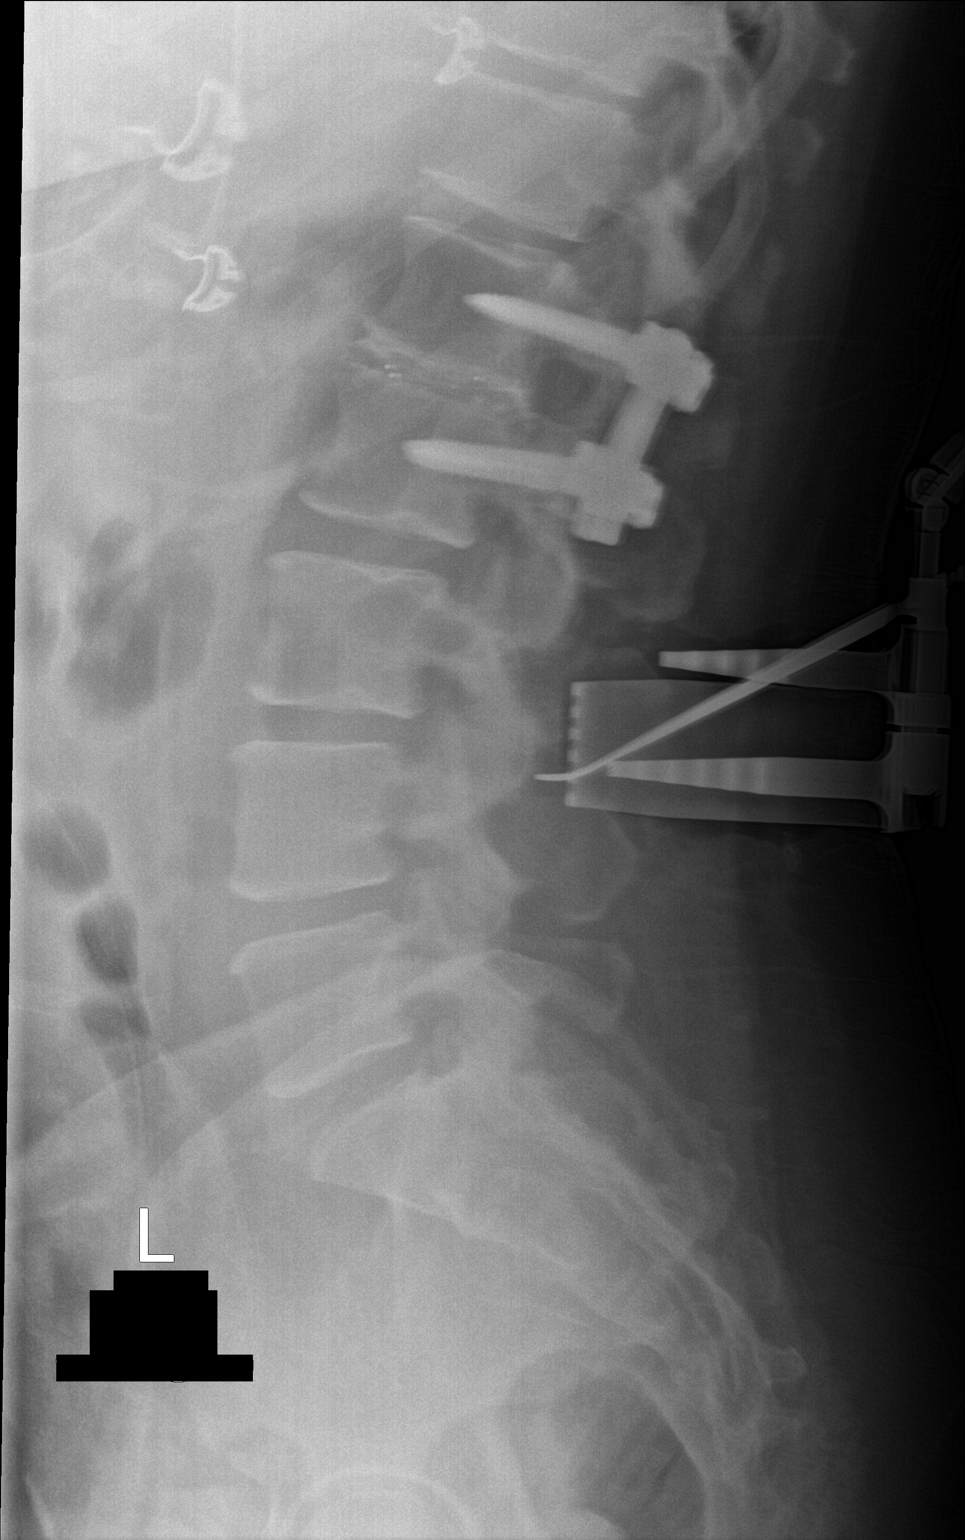

[2 of 2 positions shown; findings below may reference images not displayed]

FINDINGS: Lateral views of the lumbar spine are obtained during the
performance of the procedure and are provided for interpretation
only. Initial image demonstrates stable posterior fusion hardware at
L1/L2. Surgical instrumentation is seen posterior to the L3
vertebral body.

On the second image, surgical instrumentation is seen dorsal to the
central canal at the L3/L4 level.
IMPRESSION: 1. Intraoperative exam as above.

## 2020-07-11 SURGERY — LUMBAR LAMINECTOMY/DECOMPRESSION MICRODISCECTOMY
Anesthesia: General

## 2020-07-11 MED ORDER — SODIUM CHLORIDE 0.9% FLUSH
3.0000 mL | Freq: Two times a day (BID) | INTRAVENOUS | Status: DC
  Administered 2020-07-11 – 2020-07-12 (×2): 3 mL via INTRAVENOUS

## 2020-07-11 MED ORDER — FENTANYL CITRATE (PF) 250 MCG/5ML IJ SOLN
INTRAMUSCULAR | Status: DC | PRN
  Administered 2020-07-11 (×2): 50 ug via INTRAVENOUS
  Administered 2020-07-11: 100 ug via INTRAVENOUS
  Administered 2020-07-11 (×5): 50 ug via INTRAVENOUS

## 2020-07-11 MED ORDER — OXYCODONE HCL 5 MG PO TABS
5.0000 mg | ORAL_TABLET | ORAL | Status: DC | PRN
  Administered 2020-07-12 – 2020-07-13 (×3): 5 mg via ORAL
  Filled 2020-07-11 (×3): qty 1

## 2020-07-11 MED ORDER — OXYCODONE HCL 5 MG PO TABS: 5.0000 mg | ORAL_TABLET | Freq: Once | ORAL | Status: DC | PRN

## 2020-07-11 MED ORDER — ONDANSETRON HCL 4 MG/2ML IJ SOLN
INTRAMUSCULAR | Status: AC
  Filled 2020-07-11: qty 2

## 2020-07-11 MED ORDER — DICYCLOMINE HCL 20 MG PO TABS
20.0000 mg | ORAL_TABLET | Freq: Three times a day (TID) | ORAL | Status: DC
  Administered 2020-07-12 – 2020-07-13 (×4): 20 mg via ORAL
  Filled 2020-07-11 (×7): qty 1

## 2020-07-11 MED ORDER — SODIUM CHLORIDE 0.9% FLUSH
3.0000 mL | INTRAVENOUS | Status: DC | PRN
  Administered 2020-07-11: 3 mL via INTRAVENOUS

## 2020-07-11 MED ORDER — BUPIVACAINE LIPOSOME 1.3 % IJ SUSP
20.0000 mL | INTRAMUSCULAR | Status: AC
  Administered 2020-07-11: 10 mL
  Filled 2020-07-11: qty 20

## 2020-07-11 MED ORDER — LIDOCAINE 2% (20 MG/ML) 5 ML SYRINGE
INTRAMUSCULAR | Status: DC | PRN
  Administered 2020-07-11: 100 mg via INTRAVENOUS

## 2020-07-11 MED ORDER — DOCUSATE SODIUM 100 MG PO CAPS
100.0000 mg | ORAL_CAPSULE | Freq: Two times a day (BID) | ORAL | Status: DC
  Administered 2020-07-11 – 2020-07-13 (×4): 100 mg via ORAL
  Filled 2020-07-11 (×4): qty 1

## 2020-07-11 MED ORDER — MIDAZOLAM HCL 2 MG/2ML IJ SOLN
INTRAMUSCULAR | Status: AC
  Filled 2020-07-11: qty 2

## 2020-07-11 MED ORDER — CALCIUM CARBONATE ANTACID 500 MG PO CHEW: 2.0000 | CHEWABLE_TABLET | Freq: Three times a day (TID) | ORAL | Status: DC | PRN

## 2020-07-11 MED ORDER — ALBUTEROL SULFATE HFA 108 (90 BASE) MCG/ACT IN AERS: 2.0000 | INHALATION_SPRAY | Freq: Four times a day (QID) | RESPIRATORY_TRACT | Status: DC | PRN

## 2020-07-11 MED ORDER — ACETAMINOPHEN 500 MG PO TABS: 1000.0000 mg | ORAL_TABLET | Freq: Once | ORAL | Status: DC | PRN

## 2020-07-11 MED ORDER — IBUPROFEN 200 MG PO TABS
800.0000 mg | ORAL_TABLET | Freq: Three times a day (TID) | ORAL | Status: DC
  Administered 2020-07-11 – 2020-07-13 (×6): 800 mg via ORAL
  Filled 2020-07-11 (×6): qty 4

## 2020-07-11 MED ORDER — ALUM & MAG HYDROXIDE-SIMETH 200-200-20 MG/5ML PO SUSP: 30.0000 mL | Freq: Four times a day (QID) | ORAL | Status: DC | PRN

## 2020-07-11 MED ORDER — ALBUTEROL SULFATE (2.5 MG/3ML) 0.083% IN NEBU
2.5000 mg | INHALATION_SOLUTION | Freq: Four times a day (QID) | RESPIRATORY_TRACT | Status: DC | PRN
  Administered 2020-07-12 – 2020-07-13 (×3): 2.5 mg via RESPIRATORY_TRACT
  Filled 2020-07-11 (×3): qty 3

## 2020-07-11 MED ORDER — PHENYLEPHRINE HCL-NACL 10-0.9 MG/250ML-% IV SOLN
INTRAVENOUS | Status: DC | PRN
  Administered 2020-07-11: 25 ug/min via INTRAVENOUS

## 2020-07-11 MED ORDER — MENTHOL 3 MG MT LOZG: 1.0000 | LOZENGE | OROMUCOSAL | Status: DC | PRN

## 2020-07-11 MED ORDER — DEXMEDETOMIDINE (PRECEDEX) IN NS 20 MCG/5ML (4 MCG/ML) IV SYRINGE
PREFILLED_SYRINGE | INTRAVENOUS | Status: DC | PRN
  Administered 2020-07-11: 4 ug via INTRAVENOUS
  Administered 2020-07-11: 8 ug via INTRAVENOUS
  Administered 2020-07-11: 4 ug via INTRAVENOUS

## 2020-07-11 MED ORDER — PHENOL 1.4 % MT LIQD: 1.0000 | OROMUCOSAL | Status: DC | PRN

## 2020-07-11 MED ORDER — FENTANYL CITRATE (PF) 100 MCG/2ML IJ SOLN: 25.0000 ug | INTRAMUSCULAR | Status: DC | PRN

## 2020-07-11 MED ORDER — DEXAMETHASONE SODIUM PHOSPHATE 10 MG/ML IJ SOLN
INTRAMUSCULAR | Status: AC
  Filled 2020-07-11: qty 1

## 2020-07-11 MED ORDER — FENTANYL CITRATE (PF) 250 MCG/5ML IJ SOLN
INTRAMUSCULAR | Status: AC
  Filled 2020-07-11: qty 5

## 2020-07-11 MED ORDER — VITAMIN D 25 MCG (1000 UNIT) PO TABS
5000.0000 [IU] | ORAL_TABLET | ORAL | Status: DC
  Administered 2020-07-11: 5000 [IU] via ORAL
  Filled 2020-07-11: qty 5

## 2020-07-11 MED ORDER — GABAPENTIN 300 MG PO CAPS
300.0000 mg | ORAL_CAPSULE | Freq: Three times a day (TID) | ORAL | Status: DC
  Administered 2020-07-11 – 2020-07-13 (×6): 300 mg via ORAL
  Filled 2020-07-11 (×6): qty 1

## 2020-07-11 MED ORDER — ROCURONIUM BROMIDE 10 MG/ML (PF) SYRINGE
PREFILLED_SYRINGE | INTRAVENOUS | Status: AC
  Filled 2020-07-11: qty 10

## 2020-07-11 MED ORDER — DIAZEPAM 5 MG PO TABS
10.0000 mg | ORAL_TABLET | Freq: Two times a day (BID) | ORAL | Status: DC | PRN
  Administered 2020-07-11 – 2020-07-12 (×2): 10 mg via ORAL
  Filled 2020-07-11 (×2): qty 2

## 2020-07-11 MED ORDER — ORAL CARE MOUTH RINSE: 15.0000 mL | Freq: Once | OROMUCOSAL | Status: AC

## 2020-07-11 MED ORDER — ADULT MULTIVITAMIN W/MINERALS CH
1.0000 | ORAL_TABLET | Freq: Every day | ORAL | Status: DC
  Administered 2020-07-11 – 2020-07-13 (×3): 1 via ORAL
  Filled 2020-07-11 (×3): qty 1

## 2020-07-11 MED ORDER — PROPOFOL 10 MG/ML IV BOLUS
INTRAVENOUS | Status: DC | PRN
  Administered 2020-07-11: 200 mg via INTRAVENOUS
  Administered 2020-07-11: 50 mg via INTRAVENOUS

## 2020-07-11 MED ORDER — LORATADINE 10 MG PO TABS
10.0000 mg | ORAL_TABLET | Freq: Every day | ORAL | Status: DC
  Administered 2020-07-11 – 2020-07-13 (×3): 10 mg via ORAL
  Filled 2020-07-11 (×3): qty 1

## 2020-07-11 MED ORDER — ONDANSETRON HCL 4 MG PO TABS: 4.0000 mg | ORAL_TABLET | Freq: Four times a day (QID) | ORAL | Status: DC | PRN

## 2020-07-11 MED ORDER — OXYCODONE HCL ER 20 MG PO T12A
20.0000 mg | EXTENDED_RELEASE_TABLET | Freq: Two times a day (BID) | ORAL | Status: DC
  Administered 2020-07-11 – 2020-07-13 (×4): 20 mg via ORAL
  Filled 2020-07-11 (×4): qty 1

## 2020-07-11 MED ORDER — CHLORHEXIDINE GLUCONATE 0.12 % MT SOLN
15.0000 mL | Freq: Once | OROMUCOSAL | Status: AC
  Administered 2020-07-11: 15 mL via OROMUCOSAL
  Filled 2020-07-11: qty 15

## 2020-07-11 MED ORDER — ROCURONIUM BROMIDE 10 MG/ML (PF) SYRINGE
PREFILLED_SYRINGE | INTRAVENOUS | Status: DC | PRN
  Administered 2020-07-11: 70 mg via INTRAVENOUS
  Administered 2020-07-11: 30 mg via INTRAVENOUS

## 2020-07-11 MED ORDER — POLYETHYLENE GLYCOL 3350 17 G PO PACK
17.0000 g | PACK | Freq: Every day | ORAL | Status: DC | PRN
  Administered 2020-07-13: 17 g via ORAL
  Filled 2020-07-11: qty 1

## 2020-07-11 MED ORDER — MIDAZOLAM HCL 2 MG/2ML IJ SOLN
INTRAMUSCULAR | Status: DC | PRN
  Administered 2020-07-11: 2 mg via INTRAVENOUS

## 2020-07-11 MED ORDER — SODIUM CHLORIDE 0.9 % IV SOLN
250.0000 mL | INTRAVENOUS | Status: DC
  Administered 2020-07-11: 250 mL via INTRAVENOUS

## 2020-07-11 MED ORDER — MEPERIDINE HCL 25 MG/ML IJ SOLN: 6.2500 mg | INTRAMUSCULAR | Status: DC | PRN

## 2020-07-11 MED ORDER — ALBUMIN HUMAN 5 % IV SOLN: INTRAVENOUS | Status: DC | PRN

## 2020-07-11 MED ORDER — BUPIVACAINE HCL (PF) 0.5 % IJ SOLN
INTRAMUSCULAR | Status: AC
  Filled 2020-07-11: qty 30

## 2020-07-11 MED ORDER — BUPIVACAINE HCL 0.5 % IJ SOLN
INTRAMUSCULAR | Status: DC | PRN
  Administered 2020-07-11: 10 mL

## 2020-07-11 MED ORDER — ONDANSETRON HCL 4 MG/2ML IJ SOLN: 4.0000 mg | Freq: Once | INTRAMUSCULAR | Status: DC | PRN

## 2020-07-11 MED ORDER — ACETAMINOPHEN 160 MG/5ML PO SOLN: 325.0000 mg | ORAL | Status: DC | PRN

## 2020-07-11 MED ORDER — CEFAZOLIN SODIUM-DEXTROSE 2-4 GM/100ML-% IV SOLN
2.0000 g | INTRAVENOUS | Status: AC
  Administered 2020-07-11: 2 g via INTRAVENOUS
  Filled 2020-07-11: qty 100

## 2020-07-11 MED ORDER — DEXAMETHASONE SODIUM PHOSPHATE 10 MG/ML IJ SOLN
INTRAMUSCULAR | Status: DC | PRN
  Administered 2020-07-11: 10 mg via INTRAVENOUS

## 2020-07-11 MED ORDER — OXYCODONE HCL 5 MG/5ML PO SOLN: 5.0000 mg | Freq: Once | ORAL | Status: DC | PRN

## 2020-07-11 MED ORDER — METHOCARBAMOL 1000 MG/10ML IJ SOLN
500.0000 mg | Freq: Four times a day (QID) | INTRAVENOUS | Status: DC | PRN
  Filled 2020-07-11: qty 5

## 2020-07-11 MED ORDER — 0.9 % SODIUM CHLORIDE (POUR BTL) OPTIME
TOPICAL | Status: DC | PRN
  Administered 2020-07-11: 1000 mL

## 2020-07-11 MED ORDER — MIRABEGRON ER 50 MG PO TB24
50.0000 mg | ORAL_TABLET | Freq: Every day | ORAL | Status: DC
  Administered 2020-07-11 – 2020-07-12 (×2): 50 mg via ORAL
  Filled 2020-07-11 (×3): qty 1

## 2020-07-11 MED ORDER — ONDANSETRON HCL 4 MG/2ML IJ SOLN: 4.0000 mg | Freq: Four times a day (QID) | INTRAMUSCULAR | Status: DC | PRN

## 2020-07-11 MED ORDER — METHOCARBAMOL 500 MG PO TABS
500.0000 mg | ORAL_TABLET | Freq: Four times a day (QID) | ORAL | Status: DC | PRN
  Administered 2020-07-11: 500 mg via ORAL
  Filled 2020-07-11: qty 1

## 2020-07-11 MED ORDER — ACETAMINOPHEN 10 MG/ML IV SOLN: 1000.0000 mg | Freq: Once | INTRAVENOUS | Status: DC | PRN

## 2020-07-11 MED ORDER — PROPOFOL 10 MG/ML IV BOLUS
INTRAVENOUS | Status: AC
  Filled 2020-07-11: qty 40

## 2020-07-11 MED ORDER — LACTATED RINGERS IV SOLN: INTRAVENOUS | Status: DC

## 2020-07-11 MED ORDER — ACETAMINOPHEN 160 MG/5ML PO SOLN: 1000.0000 mg | Freq: Once | ORAL | Status: DC | PRN

## 2020-07-11 MED ORDER — ATORVASTATIN CALCIUM 40 MG PO TABS
40.0000 mg | ORAL_TABLET | Freq: Every day | ORAL | Status: DC
  Administered 2020-07-11 – 2020-07-13 (×3): 40 mg via ORAL
  Filled 2020-07-11 (×3): qty 1

## 2020-07-11 MED ORDER — BISACODYL 5 MG PO TBEC: 5.0000 mg | DELAYED_RELEASE_TABLET | Freq: Every day | ORAL | Status: DC | PRN

## 2020-07-11 MED ORDER — HEMOSTATIC AGENTS (NO CHARGE) OPTIME
TOPICAL | Status: DC | PRN
  Administered 2020-07-11: 1 via TOPICAL

## 2020-07-11 MED ORDER — CEFAZOLIN SODIUM-DEXTROSE 2-4 GM/100ML-% IV SOLN
2.0000 g | Freq: Three times a day (TID) | INTRAVENOUS | Status: AC
  Administered 2020-07-11 – 2020-07-12 (×2): 2 g via INTRAVENOUS
  Filled 2020-07-11 (×2): qty 100

## 2020-07-11 MED ORDER — PHENYLEPHRINE 40 MCG/ML (10ML) SYRINGE FOR IV PUSH (FOR BLOOD PRESSURE SUPPORT)
PREFILLED_SYRINGE | INTRAVENOUS | Status: DC | PRN
  Administered 2020-07-11: 40 ug via INTRAVENOUS
  Administered 2020-07-11: 120 ug via INTRAVENOUS

## 2020-07-11 MED ORDER — HYDROMORPHONE HCL 1 MG/ML IJ SOLN: 0.5000 mg | INTRAMUSCULAR | Status: DC | PRN

## 2020-07-11 MED ORDER — OXYCODONE HCL 5 MG PO TABS
10.0000 mg | ORAL_TABLET | ORAL | Status: DC | PRN
  Administered 2020-07-12: 5 mg via ORAL
  Filled 2020-07-11 (×2): qty 2

## 2020-07-11 MED ORDER — ONDANSETRON HCL 4 MG/2ML IJ SOLN
INTRAMUSCULAR | Status: DC | PRN
  Administered 2020-07-11: 4 mg via INTRAVENOUS

## 2020-07-11 MED ORDER — FLUTICASONE PROPIONATE 50 MCG/ACT NA SUSP
1.0000 | Freq: Every day | NASAL | Status: DC | PRN
  Filled 2020-07-11: qty 16

## 2020-07-11 MED ORDER — THROMBIN 20000 UNITS EX SOLR
CUTANEOUS | Status: DC | PRN
  Administered 2020-07-11: 20 mL via TOPICAL

## 2020-07-11 MED ORDER — FUROSEMIDE 40 MG PO TABS
40.0000 mg | ORAL_TABLET | Freq: Every day | ORAL | Status: DC
  Administered 2020-07-11 – 2020-07-13 (×3): 40 mg via ORAL
  Filled 2020-07-11 (×3): qty 1

## 2020-07-11 MED ORDER — FLEET ENEMA 7-19 GM/118ML RE ENEM: 1.0000 | ENEMA | Freq: Once | RECTAL | Status: DC | PRN

## 2020-07-11 MED ORDER — LIDOCAINE HCL (PF) 2 % IJ SOLN
INTRAMUSCULAR | Status: AC
  Filled 2020-07-11: qty 5

## 2020-07-11 MED ORDER — SUGAMMADEX SODIUM 200 MG/2ML IV SOLN
INTRAVENOUS | Status: DC | PRN
  Administered 2020-07-11 (×5): 50 mg via INTRAVENOUS

## 2020-07-11 MED ORDER — SODIUM CHLORIDE 0.9 % IV SOLN: INTRAVENOUS | Status: DC

## 2020-07-11 MED ORDER — ACETAMINOPHEN 325 MG PO TABS: 325.0000 mg | ORAL_TABLET | ORAL | Status: DC | PRN

## 2020-07-11 MED ORDER — PANTOPRAZOLE SODIUM 40 MG PO TBEC
80.0000 mg | DELAYED_RELEASE_TABLET | Freq: Every day | ORAL | Status: DC
  Administered 2020-07-11 – 2020-07-13 (×3): 80 mg via ORAL
  Filled 2020-07-11 (×3): qty 2

## 2020-07-11 MED ORDER — THROMBIN (RECOMBINANT) 20000 UNITS EX SOLR
CUTANEOUS | Status: AC
  Filled 2020-07-11: qty 20000

## 2020-07-11 SURGICAL SUPPLY — 55 items
ADH SKN CLS APL DERMABOND .7 (GAUZE/BANDAGES/DRESSINGS) ×1
AGENT HMST KT MTR STRL THRMB (HEMOSTASIS) ×1
BUR SABER RD CUTTING 3.0 (BURR) ×1 IMPLANT
BUR SABER RD CUTTING 3.0MM (BURR) ×1
CANISTER SUCT 3000ML PPV (MISCELLANEOUS) ×3 IMPLANT
COVER SURGICAL LIGHT HANDLE (MISCELLANEOUS) ×3 IMPLANT
COVER WAND RF STERILE (DRAPES) ×3 IMPLANT
DERMABOND ADVANCED (GAUZE/BANDAGES/DRESSINGS) ×2
DERMABOND ADVANCED .7 DNX12 (GAUZE/BANDAGES/DRESSINGS) ×1 IMPLANT
DRAPE HALF SHEET 40X57 (DRAPES) IMPLANT
DRAPE INCISE IOBAN 66X45 STRL (DRAPES) IMPLANT
DRAPE MICROSCOPE LEICA (MISCELLANEOUS) ×3 IMPLANT
DRAPE SURG 17X23 STRL (DRAPES) ×12 IMPLANT
DRSG MEPILEX BORDER 4X4 (GAUZE/BANDAGES/DRESSINGS) IMPLANT
DRSG MEPILEX BORDER 4X8 (GAUZE/BANDAGES/DRESSINGS) ×2 IMPLANT
DURAPREP 26ML APPLICATOR (WOUND CARE) ×3 IMPLANT
ELECT REM PT RETURN 9FT ADLT (ELECTROSURGICAL) ×3
ELECTRODE REM PT RTRN 9FT ADLT (ELECTROSURGICAL) ×1 IMPLANT
EVACUATOR 1/8 PVC DRAIN (DRAIN) ×2 IMPLANT
GAUZE SPONGE 4X4 12PLY STRL (GAUZE/BANDAGES/DRESSINGS) ×2 IMPLANT
GLOVE BIOGEL PI IND STRL 8 (GLOVE) ×1 IMPLANT
GLOVE BIOGEL PI INDICATOR 8 (GLOVE) ×2
GLOVE ECLIPSE 9.0 STRL (GLOVE) ×3 IMPLANT
GLOVE ORTHO TXT STRL SZ7.5 (GLOVE) ×3 IMPLANT
GLOVE SURG 8.5 LATEX PF (GLOVE) ×3 IMPLANT
GOWN STRL REUS W/ TWL LRG LVL3 (GOWN DISPOSABLE) ×1 IMPLANT
GOWN STRL REUS W/TWL 2XL LVL3 (GOWN DISPOSABLE) ×6 IMPLANT
GOWN STRL REUS W/TWL LRG LVL3 (GOWN DISPOSABLE) ×3
KIT BASIN OR (CUSTOM PROCEDURE TRAY) ×3 IMPLANT
KIT TURNOVER KIT B (KITS) ×3 IMPLANT
NDL SPNL 18GX3.5 QUINCKE PK (NEEDLE) ×2 IMPLANT
NEEDLE SPNL 18GX3.5 QUINCKE PK (NEEDLE) ×6 IMPLANT
NS IRRIG 1000ML POUR BTL (IV SOLUTION) ×3 IMPLANT
PACK LAMINECTOMY ORTHO (CUSTOM PROCEDURE TRAY) ×3 IMPLANT
PAD ARMBOARD 7.5X6 YLW CONV (MISCELLANEOUS) ×6 IMPLANT
PATTIES SURGICAL .5 X.5 (GAUZE/BANDAGES/DRESSINGS) ×2 IMPLANT
PATTIES SURGICAL .75X.75 (GAUZE/BANDAGES/DRESSINGS) ×2 IMPLANT
PATTIES SURGICAL 1X1 (DISPOSABLE) IMPLANT
SPONGE LAP 4X18 RFD (DISPOSABLE) IMPLANT
SPONGE SURGIFOAM ABS GEL 100 (HEMOSTASIS) ×2 IMPLANT
SURGIFLO W/THROMBIN 8M KIT (HEMOSTASIS) ×2 IMPLANT
SUT ETHILON 2 0 FS 18 (SUTURE) ×2 IMPLANT
SUT VIC AB 0 CT1 27 (SUTURE)
SUT VIC AB 0 CT1 27XBRD ANBCTR (SUTURE) IMPLANT
SUT VIC AB 1 CT1 27 (SUTURE)
SUT VIC AB 1 CT1 27XBRD ANBCTR (SUTURE) IMPLANT
SUT VIC AB 1 CTX 27 (SUTURE) ×2 IMPLANT
SUT VIC AB 2-0 CT1 27 (SUTURE)
SUT VIC AB 2-0 CT1 TAPERPNT 27 (SUTURE) IMPLANT
SUT VIC AB 3-0 X1 27 (SUTURE) ×3 IMPLANT
SUT VICRYL 0 UR6 27IN ABS (SUTURE) ×3 IMPLANT
TOWEL GREEN STERILE (TOWEL DISPOSABLE) ×3 IMPLANT
TOWEL GREEN STERILE FF (TOWEL DISPOSABLE) ×3 IMPLANT
TRAY FOLEY MTR SLVR 16FR STAT (SET/KITS/TRAYS/PACK) ×2 IMPLANT
WATER STERILE IRR 1000ML POUR (IV SOLUTION) ×3 IMPLANT

## 2020-07-11 NOTE — Anesthesia Procedure Notes (Signed)
Procedure Name: Intubation Date/Time: 07/11/2020 1:00 PM Performed by: Rande Brunt, CRNA Pre-anesthesia Checklist: Patient identified, Emergency Drugs available, Suction available and Patient being monitored Patient Re-evaluated:Patient Re-evaluated prior to induction Oxygen Delivery Method: Circle System Utilized Preoxygenation: Pre-oxygenation with 100% oxygen Induction Type: IV induction Ventilation: Mask ventilation without difficulty Laryngoscope Size: Mac and 4 Grade View: Grade I Tube type: Oral Tube size: 7.0 mm Number of attempts: 1 Airway Equipment and Method: Stylet and Oral airway Placement Confirmation: ETT inserted through vocal cords under direct vision,  positive ETCO2 and breath sounds checked- equal and bilateral Secured at: 22 cm Tube secured with: Tape Dental Injury: Teeth and Oropharynx as per pre-operative assessment

## 2020-07-11 NOTE — Discharge Instructions (Addendum)
    No lifting greater than 10 lbs. Avoid bending, stooping and twisting. Walk in house for first week them may start to get out slowly increasing distance up to one mile by 3 weeks post op. Keep incision dry for 3 days, may use tegaderm or similar water impervious dressing.  

## 2020-07-11 NOTE — Interval H&P Note (Signed)
History and Physical Interval Note:  07/11/2020 12:48 PM  Tammy Li  has presented today for surgery, with the diagnosis of left L2-3 herniated disc with extruded fragment extending down to left L3-4.  The various methods of treatment have been discussed with the patient and family. After consideration of risks, benefits and other options for treatment, the patient has consented to  Procedure(s): LEFT L3-4 HEMILAMINECTOMY WITH LEFT L2-3 AND L3-4 EXCISION OF EXTRUDED DISC HERNIATION (N/A) as a surgical intervention.  The patient's history has been reviewed, patient examined, no change in status, stable for surgery.  I have reviewed the patient's chart and labs.  Questions were answered to the patient's satisfaction.     Vira Browns

## 2020-07-11 NOTE — Transfer of Care (Signed)
Immediate Anesthesia Transfer of Care Note  Patient: Tammy Li  Procedure(s) Performed: LEFT LUMBAR THREE THROUGH LUMBAR FOUR HEMILAMINECTOMY WITH LEFT LUMBAR TWO THROUGH LUMBAR THREE  AND LUMBAR THREE THROUGH LUMBAR FOUR  EXCISION OF EXTRUDED DISC HERNIATION (N/A )  Patient Location: PACU  Anesthesia Type:General  Level of Consciousness: awake, alert , oriented and drowsy  Airway & Oxygen Therapy: Patient Spontanous Breathing and Patient connected to face mask oxygen  Post-op Assessment: Report given to RN, Post -op Vital signs reviewed and stable and Patient moving all extremities  Post vital signs: Reviewed and stable  Last Vitals:  Vitals Value Taken Time  BP 143/94 07/11/20 1635  Temp 36.4 C 07/11/20 1635  Pulse 107 07/11/20 1640  Resp 14 07/11/20 1640  SpO2 100 % 07/11/20 1640  Vitals shown include unvalidated device data.  Last Pain:  Vitals:   07/11/20 1059  TempSrc:   PainSc: 8       Patients Stated Pain Goal: 2 (07/11/20 1059)  Complications: No complications documented.

## 2020-07-11 NOTE — Op Note (Signed)
07/11/2020  4:35 PM  PATIENT:  Tammy Li  54 y.o. 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 Ambrose Pancoast, supplemented with local marcaine 0.5% 1:1 exparel 1.3% TOTAL 20cc.     COMPLICATIONS:  None.     EBL: 500CC  DRAINS: Foley to SD. Medium Hemovac left lumbar L3 hemilaminectomy site.   PROCEDURE:The patient was met in the holding area, and the appropriate Left Lumbar level L3-4 and L2-3 identified and marked with "x" and my initials.The patient was then transported to OR and was placed under general anesthesia without difficulty. The patient received appropriate preoperative antibiotic prophylaxis. The patient after intubation a sterile foley catheter placed atraumatically then transferred to the operating room table, prone position, Jackson spine table. All pressure points were well padded. The arms in 90-90 well-padded at the elbows. Standard prep with CHG solution lower dorsal spine to the mid sacral segment. Draped in the usual manner clear Vi-Drape was used. Time-out procedure was called and correct. 2x 18-gauge spinal needle was then inserted at the expected L3-4 level. Cross table lateral used to identify the spinal needles positions. The lower needle was at the lower aspect of  the lamina of L3. Skin superior to this was then infiltrated with Marcaine half percent with 1:1 exparel  total of 20 cc used. An incision approximately 2 inches in length was then made through skin and subcutaneous layers in line with the left side of the expected midline just between the two spinal needle entry point. An incision made into the left lumbosacral fascia approximately an inch and one half in length .  Cobb elevator was then introduced into the incision site and used to carefully form subperiosteal dissection of the paralumbar muscles off of the left posterior lamina of the expected L3-4 and L2-3 level.The depth measured off of the Cobb at about 70 mm and 70 mm blades then placed on the scaffolding for the Loyal retractor and guided  down to and docking on the posterior aspect of the lamina at the expected left L3-4 level.  Cross table lateral radiograph was used to identify a Penfield #4 placed into the facet at the appropriate level L3-4. The operating room microscope sterilely draped brought into the field. Under the operating room microscope, the L3-4interspace carefully debrided the small amount of muscle attachment here and high-speed bur used to drill the medial aspect of the inferior articular process of L3 approximately 20%. The left hemilamina of L3 was thinned to the ligamentum flavum over the lower 75% of the hemilamina.3 mm Kerrison then used to enter the spinal canal over the superior aspect of the L4 lamina carefully using the Kerrison to debris the attachment over the superior lamina of L4 and off the medial aspect of the left L4 superior articular process. Foraminotomy  was then performed over the left L4 nerve root. The medial 20% superior articular process of L4  then resected using 2 mm and 3 mm Kerrisons. This allowed for identification of the thecal sac. Penfield 4 was then used to carefully mobilize the thecal sac medially and the L4 nerve root identified within the  lateral recess flattened over the posterior aspect of the L3-4 disc. Carefully the lateral aspect of the L4 nerve root was identified and a Penfield 4 was used to mobilize the nerve medially such that the L4-5 disc was visible with microscope. Using a Penfield 4 for retraction the thecal sac was retracted medially and the left posterior longitudinal ligament noted to be posteriorly displaced,  a 15 blade scalpel was used to incise the posterior longitudinal ligament within the lateral recess on the left side longitudinally. Disc material was removed using micropituitary rongeurs and nerve hook nerve root and then more easily able to be mobilized medially and retracted using a Derricho retractor. The disc was evaluated and did have a small tear of the left paracentral region but no protrusion from the disc intself. The left hemilaminectomy was continued superiorly exposing the left L2-3 interspace and resecting the left L2-3 ligmentum flavum. The left L2-3 disc space identified and the left L3 nerve root identified and retracted with a Penfield #4. A large extruded disc was found in the left lateral recess and entering the left L3 neuroforamen deep to the left L3 nerve root. Further foraminotomies was performed over the L3 nerve root the nerve root was noted to be decompress without further compression. The nerve root able to be retracted along the medial aspect of the L3 pedicle and disc material found to be subligamentous at this level was further resected current pituitary rongeurs. Free disc fragments as many as 5 separate pieces were removed ventral to the left L3 nerve root at the entry area into the left L3 neuroforamen. The area within the L3 axilla was also examined and no further disc material or disc fragments were found.  Ligamentum flavum was further debrided superiorly at the level L2-3disc.No disc protrusion noted from the posterior left L2-3 level. Woodson nerve probe was then able to carefully  palpate the neuroforamen for L3 and L4 finding these to be well decompressed. Bleeding was then controlled using surgiflow, thrombin-soaked Gelfoam small cottonoids. There were large epidural veins and a moderate amount of bleeding epidural laminotomy area was controlled using bipolar electrocautery.surgiflow and thrombin soaked gel foam. Irrigation was carried out using copious amounts of irrigant solution. All Gelfoam were then removed. No significant active bleeding present at the time of removal. A medium sized hemovac drain then placed exiting from the left lower lumbar area. All instruments sponge counts were correct traction system was then carefully removed carefully rotating retractors with this withdrawal and only bipolar electrocautery of any small bleeders. Lumbodorsal fascia was then carefully approximated with interrupted 0 Vicryl sutures, UR 6 needle deep subcutaneous layers were approximated with interrupted 0 Vicryl sutures on UR 6 the appear subcutaneous layers approximated with interrupted 2-0 Vicryl sutures and the skin closed with a running subcutaneous stitch of 4-0 Vicryl. Dermabond was applied allowed to dry and then Mepilex bandage applied. Patient was then carefully returned to supine position on a stretcher, reactivated and extubated. He was then returned to recovery room in satisfactory condition.  Benjiman Core PA-C perform the duties of assistant surgeon during this case. he was present from the beginning of the case to the end  of the case assisting in transfer the patient from his stretcher to the OR table and back to the stretcher at the end of the case. Assisted in careful retraction and suction of the laminectomy site delicate neural structures operating under the operating room microscope. he performed closure of the incision from the fascia to the skin applying the dressing.     Basil Dess  07/11/2020, 4:35 PM

## 2020-07-11 NOTE — Progress Notes (Signed)
The patient reports having pain and scratchy feeling in the left eye.  She reported that the nurse in PACU rinsed her eye with saline.  He left eye appears slightly red.  The patient wears glasses.

## 2020-07-11 NOTE — Plan of Care (Signed)

## 2020-07-11 NOTE — Anesthesia Preprocedure Evaluation (Addendum)
Anesthesia Evaluation  Patient identified by MRN, date of birth, ID band Patient awake    Reviewed: Allergy & Precautions, H&P , NPO status , Patient's Chart, lab work & pertinent test results, reviewed documented beta blocker date and time   History of Anesthesia Complications (+) Family history of anesthesia reaction  Airway Mallampati: I  TM Distance: >3 FB Neck ROM: full    Dental no notable dental hx. (+) Teeth Intact, Dental Advisory Given   Pulmonary neg pulmonary ROS, Current Smoker,    Pulmonary exam normal breath sounds clear to auscultation       Cardiovascular Exercise Tolerance: Good negative cardio ROS   Rhythm:regular Rate:Normal     Neuro/Psych  Headaches, PSYCHIATRIC DISORDERS Anxiety Cerebral palsy,  Weakness in b/l lower extremities.  Ambulates with great difficulty.    Neuromuscular disease    GI/Hepatic Neg liver ROS, GERD  Medicated,  Endo/Other  Morbid obesity  Renal/GU negative Renal ROS  negative genitourinary   Musculoskeletal   Abdominal (+) + obese,   Peds  Hematology negative hematology ROS (+)   Anesthesia Other Findings   Reproductive/Obstetrics negative OB ROS                            Anesthesia Physical Anesthesia Plan  ASA: III  Anesthesia Plan: General   Post-op Pain Management:    Induction: Intravenous  PONV Risk Score and Plan: 3 and Ondansetron, Dexamethasone and Treatment may vary due to age or medical condition  Airway Management Planned: Oral ETT  Additional Equipment: None  Intra-op Plan:   Post-operative Plan: Extubation in OR  Informed Consent: I have reviewed the patients History and Physical, chart, labs and discussed the procedure including the risks, benefits and alternatives for the proposed anesthesia with the patient or authorized representative who has indicated his/her understanding and acceptance.     Dental  Advisory Given  Plan Discussed with: CRNA and Anesthesiologist  Anesthesia Plan Comments: (  )       Anesthesia Quick Evaluation

## 2020-07-11 NOTE — H&P (Signed)
Tammy Li is an 54 y.o. female.   Chief Complaint: low back pain and left LE radiculopathy  HPI: 54 year old white female history of left L2-3 HNP low back pain and left lower extremity radiculopathy comes in for preop evaluation.  States that symptoms unchanged from previous visit and she is wanting to proceed with left L3-4 hemilaminectomy with left L2-3 and L3-4 excision of extruded disc herniation.  Past Medical History:  Diagnosis Date  . Arthritis    generalized  . Cerebral palsy (HCC)    since birth  . Claustrophobia   . CP (cerebral palsy) (HCC)   . Dyspnea   . Family history of adverse reaction to anesthesia    daughter wakes up during anesthesia  . GERD (gastroesophageal reflux disease)   . Headache(784.0)   . Neuromuscular disorder (HCC)    cp  . Seasonal allergies     Past Surgical History:  Procedure Laterality Date  . BACK SURGERY    . DIAGNOSTIC LAPAROSCOPY     surgery for endometriosis  . HAND SURGERY Left    cyst  . KNEE ARTHROSCOPY Right   . LEG SURGERY Bilateral    heel cord lengthening  rt and lf plus 2 other   . POSTERIOR CERVICAL FUSION/FORAMINOTOMY N/A 12/08/2012   Procedure: Left C3-4 and C6-7 foraminotomy with excision of HNP;  Surgeon: [name], MD;  Location: [facility] OR;  Service: Orthopedics;  Laterality: N/A;  . TUBAL LIGATION      No family history on file. Social History:  reports that she has been smoking cigarettes. She has a 5.00 pack-year smoking history. She has never used smokeless tobacco. She reports that she does not drink alcohol and does not use drugs.  Allergies:  Allergies  Allergen Reactions  . Sulfa Antibiotics Nausea And Vomiting  . Shellfish-Derived Products Nausea And Vomiting and Rash    No medications prior to admission.    Results for orders placed or performed during the hospital encounter of 07/09/20 (from the past 48 hour(s))  SARS CORONAVIRUS 2 (TAT 6-24 HRS) Nasopharyngeal Nasopharyngeal Swab     Status:  None   Collection Time: 07/09/20  3:20 PM   Specimen: Nasopharyngeal Swab  Result Value Ref Range   SARS Coronavirus 2 NEGATIVE NEGATIVE    Comment: (NOTE) SARS-CoV-2 target nucleic acids are NOT DETECTED.  The SARS-CoV-2 RNA is generally detectable in upper and lower respiratory specimens during the acute phase of infection. Negative results do not preclude SARS-CoV-2 infection, do not rule out co-infections with other pathogens, and should not be used as the sole basis for treatment or other patient management decisions. Negative results must be combined with clinical observations, patient history, and epidemiological information. The expected result is Negative.  Fact Sheet for Patients: HairSlick.no  Fact Sheet for Healthcare Providers: quierodirigir.com  This test is not yet approved or cleared by the [place] FDA and  has been authorized for detection and/or diagnosis of SARS-CoV-2 by FDA under an Emergency Use Authorization (EUA). This EUA will remain  in effect (meaning this test can be used) for the duration of the COVID-19 declaration under Se ction 564(b)(1) of the Act, 21 U.S.C. section 360bbb-3(b)(1), unless the authorization is terminated or revoked sooner.  Performed at [facility], [address] [place], [place] [address]    No results found.  Review of Systems  Constitutional: Positive for activity change.  HENT: Negative.   Respiratory: Negative.   Cardiovascular: Negative.   Genitourinary: Negative.  Neurological: Negative.     There were no vitals taken for this visit. Physical Exam Constitutional:      Appearance: Normal appearance.  HENT:     Head: Normocephalic and atraumatic.     Nose: Nose normal.  Eyes:     Extraocular Movements: Extraocular movements intact.     Pupils: Pupils are equal, round, and reactive to light.  Cardiovascular:     Heart sounds: Normal heart  sounds.  Pulmonary:     Effort: Pulmonary effort is normal. No respiratory distress.  Musculoskeletal:        General: Tenderness present.     Cervical back: Normal range of motion.  Neurological:     Mental Status: She is alert and oriented to person, place, and time.  Psychiatric:        Mood and Affect: Mood normal.      Assessment/Plan Left L2-3 extruded fragment.   Will proceed with surgery as scheduled. Surgical procedure discussed and all questions answered.   Zonia Kief, PA-C 07/11/2020, 1:10 AM

## 2020-07-11 NOTE — Brief Op Note (Signed)
07/11/2020  4:08 PM  PATIENT:  Tammy Li  54 y.o. 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 LOCAL MEDICATIONS USED:  MARCAINE 0.5% 1:1 EXPAREL 1.3% Amount: 20 ml  SPECIMEN:  No Specimen  DISPOSITION OF SPECIMEN:  N/A  COUNTS:  YES  TOURNIQUET:  * No tourniquets in log *  DICTATION: .Dragon Dictation  PLAN OF CARE: Admit for overnight observation  PATIENT DISPOSITION:  PACU - hemodynamically stable.   Delay start of Pharmacological VTE agent (>24hrs) due to surgical blood loss or risk of bleeding: yes

## 2020-07-12 DIAGNOSIS — M5116 Intervertebral disc disorders with radiculopathy, lumbar region: Secondary | ICD-10-CM | POA: Diagnosis not present

## 2020-07-12 LAB — BASIC METABOLIC PANEL
Anion gap: 14 (ref 5–15)
BUN: 8 mg/dL (ref 6–20)
CO2: 25 mmol/L (ref 22–32)
Calcium: 9.2 mg/dL (ref 8.9–10.3)
Chloride: 102 mmol/L (ref 98–111)
Creatinine, Ser: 0.92 mg/dL (ref 0.44–1.00)
GFR, Estimated: >60 mL/min (ref >60)
Glucose, Bld: 136 mg/dL — ABNORMAL HIGH (ref 70–99)
Potassium: 3.8 mmol/L (ref 3.5–5.1)
Sodium: 141 mmol/L (ref 135–145)

## 2020-07-12 LAB — CBC
HCT: 37 % (ref 36.0–46.0)
Hemoglobin: 12.6 g/dL (ref 12.0–15.0)
MCH: 29.2 pg (ref 26.0–34.0)
MCHC: 34.1 g/dL (ref 30.0–36.0)
MCV: 85.6 fL (ref 80.0–100.0)
Platelets: 276 10*3/uL (ref 150–400)
RBC: 4.32 MIL/uL (ref 3.87–5.11)
RDW: 13.2 % (ref 11.5–15.5)
WBC: 11.1 10*3/uL — ABNORMAL HIGH (ref 4.0–10.5)
nRBC: 0 % (ref 0.0–0.2)

## 2020-07-12 MED ORDER — SIMETHICONE 80 MG PO CHEW
80.0000 mg | CHEWABLE_TABLET | Freq: Four times a day (QID) | ORAL | Status: DC | PRN
  Administered 2020-07-12 – 2020-07-13 (×2): 80 mg via ORAL
  Filled 2020-07-12 (×2): qty 1

## 2020-07-12 MED ORDER — ALUM & MAG HYDROXIDE-SIMETH 200-200-20 MG/5ML PO SUSP: 30.0000 mL | Freq: Four times a day (QID) | ORAL | Status: DC | PRN

## 2020-07-12 NOTE — Evaluation (Signed)
Occupational Therapy Evaluation Patient Details Name: Tammy Li MRN: [id number] DOB: 03-May-1966 Today's Date: 07/12/2020    History of Present Illness  54 year old female history of left L2-3 HNP low back pain and left lower extremity radiculopathy comes in for preop evaluation.  States that symptoms unchanged from previous visit and she is wanting to proceed with left L3-4 hemilaminectomy with left L2-3 and L3-4 excision of extruded disc herniation. PMH: Cerebral palsy, GERD, arthritis.    Clinical Impression   PTA, pt lives with family and reports light assist for LB ADLs, mobility with use of Rollator and difficulty completing IADLs due to back pain. Pt endorses frequent falls at home. Pt presents now with deficits in pain, standing balance, strength, endurance. Pt only able to recall 1/3 spinal precautions at end of session today. Pt requires heavy Mod A for bed mobility, Mod A for sit to stand and taking steps to chair with RW at Min A. L LE buckling throughout standing activities placing pt at high risk for falls. Pt requires Min A for UB ADLs and Mod A for LB ADLs due to deficits.  Pt reports not comfortable discharging home today (plans to DC to daughter's home due to more accessible bathroom setup). Pt presents as high fall risk, but declines SNF for short term rehab at this time. Pt would benefit from additional therapy sessions tomorrow if possible to maximize safety in returning home. Recommend wheelchair for safety with long distance mobility.     Follow Up Recommendations  SNF;Other (comment) (if pt continues to decline SNF, recommend HHOT & 24/7 assist)    Equipment Recommendations  3 in 1 bedside commode;Wheelchair (measurements OT);Wheelchair cushion (measurements OT)    Recommendations for Other Services       Precautions / Restrictions Precautions Precautions: Fall;Back Precaution Booklet Issued: Yes (comment) Precaution Comments: Per orders, no brace  needed Restrictions Weight Bearing Restrictions: No      Mobility Bed Mobility Overal bed mobility: Needs Assistance Bed Mobility: Rolling;Sidelying to Sit Rolling: Mod assist Sidelying to sit: Mod assist       General bed mobility comments: Instructed on log rolling. Overall Mod A for LLE mgmt for rolling and advancing trunk to sitting     Transfers Overall transfer level: Needs assistance Equipment used: Rolling walker (2 wheeled) Transfers: Sit to/from Stand Sit to Stand: Mod assist         General transfer comment: Mod A for power up with RW, cues for hand placement and spinal precautions, Increased time needed to gain balance and upright posture. L LE buckling when taking steps to chair with RW. cues for safe sequencing    Balance Overall balance assessment: Needs assistance;History of Falls Sitting-balance support: Feet supported;Bilateral upper extremity supported Sitting balance-Leahy Scale: Fair     Standing balance support: Bilateral upper extremity supported;During functional activity Standing balance-Leahy Scale: Poor Standing balance comment: reliant on UE support and external support                           ADL either performed or assessed with clinical judgement   ADL Overall ADL's : Needs assistance/impaired Eating/Feeding: Sitting;Independent   Grooming: Set up;Oral care;Brushing hair;Sitting Grooming Details (indicate cue type and reason): Setup for grooming tasks seated in recliner Upper Body Bathing: Sitting;Minimal assistance   Lower Body Bathing: Moderate assistance;Sit to/from stand;Sitting/lateral leans   Upper Body Dressing : Minimal assistance;Sitting   Lower Body Dressing: Maximal assistance;Sit to/from stand;Sitting/lateral leans Lower Body  Dressing Details (indicate cue type and reason): Unable to reach feet or cross legs for sock mgmt. Educated on AE to assist with task Toilet Transfer: Minimal  assistance;Stand-pivot;RW Statistician Details (indicate cue type and reason): simulated to recliner. L LE buckling, mild jerky movements noted Toileting- Clothing Manipulation and Hygiene: Moderate assistance;Sitting/lateral lean;Sit to/from stand         General ADL Comments: Pt limited by L LE weakness, pain and impaired balance placing pt at high fall risk for ADLs/mobiltiy      Vision Baseline Vision/History: Wears glasses Wears Glasses: At all times Patient Visual Report: No change from baseline Vision Assessment?: No apparent visual deficits     Perception     Praxis      Pertinent Vitals/Pain Pain Assessment: 0-10 Pain Score: 7  Pain Location: low back Pain Descriptors / Indicators: Discomfort;Constant Pain Intervention(s): Monitored during session;Patient requesting pain meds-RN notified;Repositioned     Hand Dominance Right   Extremity/Trunk Assessment Upper Extremity Assessment Upper Extremity Assessment: Generalized weakness   Lower Extremity Assessment Lower Extremity Assessment: Defer to PT evaluation   Cervical / Trunk Assessment Cervical / Trunk Assessment: Normal   Communication Communication Communication: No difficulties   Cognition Arousal/Alertness: Awake/alert Behavior During Therapy: WFL for tasks assessed/performed Overall Cognitive Status: Impaired/Different from baseline Area of Impairment: Safety/judgement;Awareness;Problem solving;Memory                     Memory: Decreased recall of precautions   Safety/Judgement: Decreased awareness of safety;Decreased awareness of deficits Awareness: Emergent Problem Solving: Difficulty sequencing;Requires verbal cues General Comments: Pt with some decreased awareness of deficits. only able to recall 1/3 back precautions after education. pleasant and cooperative throughout.   General Comments  Educated pt on spinal precautions with handout provided. Educated on rehab recommendation but  pt politely declines and adamant on returning home    Exercises     Shoulder Instructions      Home Living Family/patient expects to be discharged to:: Private residence Living Arrangements: Spouse/significant other;Children (12 y/o, 77 y/o) Available Help at Discharge: Family;Available 24 hours/day Type of Home: Mobile home Home Access: Stairs to enter Entrance Stairs-Number of Steps: 3 Entrance Stairs-Rails: Right;Left Home Layout: One level     Bathroom Shower/Tub: Producer, television/film/video: Standard Bathroom Accessibility: Yes How Accessible: Accessible via walker Home Equipment: Bedside commode;Toilet riser;Walker - 4 wheels   Additional Comments: Pt is planning to discharge to her daughter's home for a few days (home setup info reflecting this). Her daughter is a stay at home mom, 24/7 assist available and reports her family can physical assist her. Her home has large tub and pt fearful of transferring into it at this time, so that is why she plans to DC to daughters home      Prior Functioning/Environment Level of Independence: Needs assistance  Gait / Transfers Assistance Needed: used Rollator for mobility, endorses frequent falls due to L LE weakness ADL's / Homemaking Assistance Needed: Assistance with LB ADLs due to inability to reach feet or cross legs. Assistance with tub transfers. Able to complete other ADLs, some iADLs but limited by pain   Comments: Endorses frequent falls        OT Problem List: Decreased strength;Decreased activity tolerance;Impaired balance (sitting and/or standing);Decreased safety awareness;Decreased knowledge of use of DME or AE;Decreased knowledge of precautions;Pain      OT Treatment/Interventions: Self-care/ADL training;Therapeutic exercise;DME and/or AE instruction;Therapeutic activities;Patient/family education;Balance training    OT Goals(Current goals can  be found in the care plan section) Acute Rehab OT Goals Patient  Stated Goal: go home tomorrow OT Goal Formulation: With patient Time For Goal Achievement: 07/26/20 Potential to Achieve Goals: Good ADL Goals Pt Will Perform Grooming: with supervision;standing Pt Will Perform Lower Body Dressing: with adaptive equipment;sitting/lateral leans;sit to/from stand;with supervision Pt Will Transfer to Toilet: with supervision;ambulating;bedside commode Pt Will Perform Toileting - Clothing Manipulation and hygiene: with supervision;sitting/lateral leans;sit to/from stand Additional ADL Goal #1: Pt to demo ability to recall 3/3 spinal precautions without verbal cues  OT Frequency: Min 2X/week   Barriers to D/C:            Co-evaluation              AM-PAC OT "6 Clicks" Daily Activity     Outcome Measure Help from another person eating meals?: None Help from another person taking care of personal grooming?: None Help from another person toileting, which includes using toliet, bedpan, or urinal?: A Lot Help from another person bathing (including washing, rinsing, drying)?: A Lot Help from another person to put on and taking off regular upper body clothing?: A Little Help from another person to put on and taking off regular lower body clothing?: A Lot 6 Click Score: 17   End of Session Equipment Utilized During Treatment: Gait belt;Rolling walker Nurse Communication: Mobility status;Patient requests pain meds;Precautions  Activity Tolerance: Patient tolerated treatment well Patient left: in chair;with call bell/phone within reach  OT Visit Diagnosis: Unsteadiness on feet (R26.81);Other abnormalities of gait and mobility (R26.89);Repeated falls (R29.6);Muscle weakness (generalized) (M62.81);History of falling (Z91.81);Pain Pain - part of body:  (back)                Time: 4854-6270 OT Time Calculation (min): 52 min Charges:  OT General Charges $OT Visit: 1 Visit OT Evaluation $OT Eval Moderate Complexity: 1 Mod OT Treatments $Self Care/Home  Management : 23-37 mins  Lorre Munroe, OTR/L  Lorre Munroe 07/12/2020, 9:27 AM

## 2020-07-12 NOTE — Evaluation (Signed)
Physical Therapy Evaluation Patient Details Name: Tammy Li MRN: [id number] DOB: Nov 04, 1965 Today's Date: 07/12/2020   History of Present Illness   54 year old female history of left L2-3 HNP low back pain and left lower extremity radiculopathy comes in for preop evaluation.  States that symptoms unchanged from previous visit and she is wanting to proceed with left L3-4 hemilaminectomy with left L2-3 and L3-4 excision of extruded disc herniation. PMH: Cerebral palsy, GERD, arthritis.   Clinical Impression  Patient required mod a to stand and min a to walk. She was able to stand better then earlier today per patient but she still had some safety issues. She was able to ambulate 4' and 6' with min a for stability. She needs to be able to get up three steps to get into her house. Therapy will continue to work on increasing her gait distance but if she can not safely do steps she would benefit from rehab at a SNF.     Follow Up Recommendations SNF;Home health PT (SNF V HH depending on progression )    Equipment Recommendations  Rolling walker with 5" wheels    Recommendations for Other Services       Precautions / Restrictions Precautions Precautions: Fall;Back Precaution Booklet Issued: Yes (comment) Precaution Comments: Per orders, no brace needed Restrictions Weight Bearing Restrictions: No      Mobility  Bed Mobility Overal bed mobility: Needs Assistance             General bed mobility comments: Patient found in chair.     Transfers Overall transfer level: Needs assistance Equipment used: Rolling walker (2 wheeled)             General transfer comment: Sit to stand 2x with therapy. On both occasions she required mod assist to stand. She had difficulty straightneing her left leg. Once she was standing she required min gaurd for safety  Ambulation/Gait Ambulation/Gait assistance: Min assist Gait Distance (Feet):  (6' and 4') Assistive device: Rolling walker (2  wheeled) Gait Pattern/deviations: Step-to pattern;Decreased stance time - left Gait velocity: decreased    General Gait Details: had no LOB or knee buckling but had limited endurance and required guarding for standing balance   Stairs            Wheelchair Mobility    Modified Rankin (Stroke Patients Only)       Balance Overall balance assessment: Needs assistance;History of Falls Sitting-balance support: Feet supported;Bilateral upper extremity supported Sitting balance-Leahy Scale: Fair     Standing balance support: Bilateral upper extremity supported;During functional activity Standing balance-Leahy Scale: Poor Standing balance comment: reliant on UE support and external support                             Pertinent Vitals/Pain Pain Assessment: 0-10 Pain Score: 6  Pain Location: low back Pain Descriptors / Indicators: Discomfort;Constant Pain Intervention(s): Monitored during session;Limited activity within patient's tolerance;Premedicated before session;Repositioned    Home Living Family/patient expects to be discharged to:: Private residence Living Arrangements: Spouse/significant other;Children Available Help at Discharge: Family;Available 24 hours/day Type of Home: Mobile home Home Access: Stairs to enter Entrance Stairs-Rails: Right;Left Entrance Stairs-Number of Steps: 3 Home Layout: One level Home Equipment: Bedside commode;Toilet riser;Walker - 4 wheels Additional Comments: Pt is planning to discharge to her daughter's home for a few days (home setup info reflecting this). Her daughter is a stay at home mom, 24/7 assist available and reports her family  can physical assist her. Her home has large tub and pt fearful of transferring into it at this time, so that is why she plans to DC to daughters home    Prior Function Level of Independence: Needs assistance   Gait / Transfers Assistance Needed: used Rollator for mobility, endorses frequent  falls due to L LE weakness  ADL's / Homemaking Assistance Needed: Assistance with LB ADLs due to inability to reach feet or cross legs. Assistance with tub transfers. Able to complete other ADLs, some iADLs but limited by pain  Comments: Endorses frequent falls     Hand Dominance   Dominant Hand: Right    Extremity/Trunk Assessment   Upper Extremity Assessment Upper Extremity Assessment: Defer to OT evaluation    Lower Extremity Assessment Lower Extremity Assessment: LLE deficits/detail LLE Deficits / Details: decreased strength of left LE     Cervical / Trunk Assessment Cervical / Trunk Assessment: Normal  Communication   Communication: No difficulties  Cognition Arousal/Alertness: Awake/alert Behavior During Therapy: WFL for tasks assessed/performed Overall Cognitive Status: Impaired/Different from baseline                       Memory: Decreased recall of precautions   Safety/Judgement: Decreased awareness of safety;Decreased awareness of deficits     General Comments: Patent wilth limited recall of back percautions. She was pleasent and followed all commands       General Comments General comments (skin integrity, edema, etc.): reviewed spinal percautions. Will need further cuing.     Exercises Total Joint Exercises Ankle Circles/Pumps: 20 reps Quad Sets: 15 reps Heel Slides: 15 reps   Assessment/Plan    PT Assessment Patient needs continued PT services  PT Problem List Decreased strength;Decreased range of motion;Decreased activity tolerance;Decreased mobility;Decreased knowledge of use of DME;Pain       PT Treatment Interventions DME instruction;Gait training;Stair training;Functional mobility training;Therapeutic activities;Therapeutic exercise;Patient/family education    PT Goals (Current goals can be found in the Care Plan section)  Acute Rehab PT Goals Patient Stated Goal: go home  PT Goal Formulation: With patient Time For Goal  Achievement: 07/19/20 Potential to Achieve Goals: Good    Frequency Min 5X/week   Barriers to discharge Inaccessible home environment 3 steps to get in     Co-evaluation               AM-PAC PT "6 Clicks" Mobility  Outcome Measure Help needed turning from your back to your side while in a flat bed without using bedrails?: A Little Help needed moving from lying on your back to sitting on the side of a flat bed without using bedrails?: A Little Help needed moving to and from a bed to a chair (including a wheelchair)?: A Little Help needed standing up from a chair using your arms (e.g., wheelchair or bedside chair)?: A Lot Help needed to walk in hospital room?: A Lot Help needed climbing 3-5 steps with a railing? : A Lot 6 Click Score: 15    End of Session Equipment Utilized During Treatment: Gait belt Activity Tolerance: Patient tolerated treatment well Patient left: in chair;with call bell/phone within reach;with chair alarm set Nurse Communication: Mobility status PT Visit Diagnosis: Unsteadiness on feet (R26.81);Other abnormalities of gait and mobility (R26.89);Muscle weakness (generalized) (M62.81);History of falling (Z91.81);Difficulty in walking, not elsewhere classified (R26.2)    Time: 1005-1035 PT Time Calculation (min) (ACUTE ONLY): 30 min   Charges:   PT Evaluation $PT Eval Moderate Complexity: 1 Mod  Dessie Coma PT DPT - 07/12/2020, 1:21 PM

## 2020-07-12 NOTE — Plan of Care (Signed)

## 2020-07-12 NOTE — Progress Notes (Signed)
     Subjective: 1 Day Post-Op Procedure(s) (LRB): LEFT LUMBAR THREE THROUGH LUMBAR FOUR HEMILAMINECTOMY WITH LEFT LUMBAR TWO THROUGH LUMBAR THREE  AND LUMBAR THREE THROUGH LUMBAR FOUR  EXCISION OF EXTRUDED DISC HERNIATION (N/A) Awake, alert and oriented x 4. Has scissor gait, ? Cerebral palsy lower extremity  Gait. PT saw today and plans to work with her further tomorrow.  Patient reports pain as moderate.    Objective:   VITALS:  Temp:  [97.6 F (36.4 C)-98.8 F (37.1 C)] 97.8 F (36.6 C) (12/04 1213) Pulse Rate:  [90-114] 97 (12/04 1213) Resp:  [12-35] 17 (12/04 1213) BP: (101-143)/(68-94) 122/86 (12/04 1213) SpO2:  [92 %-100 %] 95 % (12/04 1213) Weight:  [885 kg] 103 kg (12/03 1850)  Neurologically intact ABD soft Neurovascular intact Sensation intact distally Intact pulses distally Dorsiflexion/Plantar flexion intact Incision: dressing C/D/I, no drainage and Hemovac discontinued. Foley removed today.  No cellulitis present Compartment soft   LABS Recent Labs    07/09/20 1329 07/12/20 0140  HGB 14.9 12.6  WBC 9.5 11.1*  PLT 301 276   Recent Labs    07/09/20 1329 07/12/20 0140  NA 143 141  K 3.7 3.8  CL 105 102  CO2 27 25  BUN 10 8  CREATININE 1.17* 0.92  GLUCOSE 92 136*   Recent Labs    07/09/20 1329  INR 1.0     Assessment/Plan: 1 Day Post-Op Procedure(s) (LRB): LEFT LUMBAR THREE THROUGH LUMBAR FOUR HEMILAMINECTOMY WITH LEFT LUMBAR TWO THROUGH LUMBAR THREE  AND LUMBAR THREE THROUGH LUMBAR FOUR  EXCISION OF EXTRUDED DISC HERNIATION (N/A)  Advance diet Up with therapy Plan for discharge tomorrow D/C IVF.  Vira Browns 07/12/2020, 12:37 PMPatient ID: Tammy Li, female   DOB: 06/04/1966, 54 y.o.   MRN: [id number]

## 2020-07-12 NOTE — Plan of Care (Signed)
  Problem: Health Behavior/Discharge Planning: Goal: Ability to manage health-related needs will improve Outcome: Progressing   Problem: Activity: Goal: Risk for activity intolerance will decrease Outcome: Progressing   Problem: Pain Managment: Goal: General experience of comfort will improve Outcome: Progressing   

## 2020-07-13 DIAGNOSIS — M5116 Intervertebral disc disorders with radiculopathy, lumbar region: Secondary | ICD-10-CM | POA: Diagnosis not present

## 2020-07-13 MED ORDER — SIMETHICONE 80 MG PO CHEW: 80.0000 mg | CHEWABLE_TABLET | Freq: Four times a day (QID) | ORAL | 0 refills | Status: AC | PRN

## 2020-07-13 MED ORDER — IBUPROFEN 800 MG PO TABS
800.0000 mg | ORAL_TABLET | Freq: Three times a day (TID) | ORAL | 0 refills | Status: DC
Start: 2020-07-13 — End: 2021-08-20

## 2020-07-13 MED ORDER — DOCUSATE SODIUM 100 MG PO CAPS: 100.0000 mg | ORAL_CAPSULE | Freq: Two times a day (BID) | ORAL | 0 refills | Status: AC

## 2020-07-13 MED ORDER — METHOCARBAMOL 500 MG PO TABS: 500.0000 mg | ORAL_TABLET | Freq: Four times a day (QID) | ORAL | 1 refills | Status: AC | PRN

## 2020-07-13 MED ORDER — POLYETHYLENE GLYCOL 3350 17 G PO PACK: 17.0000 g | PACK | Freq: Every day | ORAL | 0 refills | Status: AC | PRN

## 2020-07-13 MED ORDER — GABAPENTIN 300 MG PO CAPS: 300.0000 mg | ORAL_CAPSULE | Freq: Three times a day (TID) | ORAL | 3 refills | Status: AC

## 2020-07-13 MED ORDER — OXYCODONE HCL 10 MG PO TABS: 10.0000 mg | ORAL_TABLET | ORAL | 0 refills | Status: AC | PRN

## 2020-07-13 NOTE — Progress Notes (Signed)
Occupational Therapy Treatment Patient Details Name: Tammy Li MRN: [id number] DOB: October 30, 1965 Today's Date: 07/13/2020    History of present illness  54 year old female history of left L2-3 HNP low back pain and left lower extremity radiculopathy comes in for preop evaluation.  States that symptoms unchanged from previous visit and she is wanting to proceed with left L3-4 hemilaminectomy with left L2-3 and L3-4 excision of extruded disc herniation. PMH: Cerebral palsy, GERD, arthritis.    OT comments  Pt. Seen for skilled OT treatment session. Reports feeling better and stronger today.  Able to ambulate to b.room from recliner for toileting and seated grooming tasks.  No buckling noted.  Reviewed energy conservation strategies for continued safety and independence with ADLS.  Will continue to progress with current POC with focus on adls while maintaining back precautions.    Follow Up Recommendations  SNF;Other (comment)    Equipment Recommendations  3 in 1 bedside commode;Wheelchair (measurements OT);Wheelchair cushion (measurements OT)    Recommendations for Other Services      Precautions / Restrictions Precautions Precautions: Fall;Back-pt. Able to name 3/3 today and maintained throughout session Precaution Comments: Per orders, no brace needed Restrictions Weight Bearing Restrictions: No       Mobility Bed Mobility               General bed mobility comments: Patient found in chair.   Transfers Overall transfer level: Needs assistance Equipment used: Rolling walker (2 wheeled) Transfers: Sit to/from [employer] Sit to Stand: Min assist Stand pivot transfers: Min assist       General transfer comment: able to ambulate to b.room today with no buckling of L knee noted.  safe pivots and use of hand placement without cues    Balance                                           ADL either performed or assessed with clinical  judgement   ADL Overall ADL's : Needs assistance/impaired     Grooming: Set up;Oral care;Sitting;Wash/dry hands Grooming Details (indicate cue type and reason): reviewed sitting for energy conservation and safety.               Lower Body Dressing Details (indicate cue type and reason): Unable to reach feet or cross legs for sock mgmt. states her dtr. assists her with LB dressing at home Toilet Transfer: Minimal assistance;RW;Ambulation;BSC;Regular Toilet Toilet Transfer Details (indicate cue type and reason): 3n1 over the commode, no jerky movements today, no buckling. followed closely with recliner jik.  ambulated from recliner to b.room Toileting- Clothing Manipulation and Hygiene: Set up;Sitting/lateral lean       Functional mobility during ADLs: Minimal assistance;Rolling walker General ADL Comments: pt. reports feeling better and stronger today.  able to ambulate to b.room for toileting and seated grooming tasks. no buckling this session but had recliner following [place] entire time for safety     Vision       Perception     Praxis      Cognition Arousal/Alertness: Awake/alert Behavior During Therapy: [facility] for tasks assessed/performed                                            Exercises     Shoulder Instructions  General Comments      Pertinent Vitals/ Pain       Pain Assessment: No/denies pain  Home Living                                          Prior Functioning/Environment              Frequency  Min 2X/week        Progress Toward Goals  OT Goals(current goals can now be found in the care plan section)  Progress towards OT goals: Progressing toward goals     Plan      Co-evaluation                 AM-PAC OT "6 Clicks" Daily Activity     Outcome Measure   Help from another person eating meals?: None Help from another person taking care of personal grooming?: None Help from another  person toileting, which includes using toliet, bedpan, or urinal?: A Lot Help from another person bathing (including washing, rinsing, drying)?: A Lot Help from another person to put on and taking off regular upper body clothing?: A Little Help from another person to put on and taking off regular lower body clothing?: A Lot 6 Click Score: 17    End of Session Equipment Utilized During Treatment: Gait belt;Rolling walker  OT Visit Diagnosis: Unsteadiness on feet (R26.81);Other abnormalities of gait and mobility (R26.89);Repeated falls (R29.6);Muscle weakness (generalized) (M62.81);History of falling (Z91.81);Pain   Activity Tolerance Patient tolerated treatment well   Patient Left in chair;with call bell/phone within reach   Nurse Communication Other (comment) (alerted rn pt. would like to have bath/shower today)        Time: 9449-6759 OT Time Calculation (min): 17 min  Charges: OT General Charges $OT Visit: 1 Visit OT Treatments $Self Care/Home Management : 8-22 mins  Boneta Lucks, COTA/L Acute Rehabilitation [phone]   Robet Leu 07/13/2020, 11:52 AM

## 2020-07-13 NOTE — TOC Initial Note (Signed)
Transition of Care [facility]) - Initial/Assessment Note    Patient Details  Name: Tammy Li MRN: [id number] Date of Birth: November 18, 1965  Transition of Care [facility]) CM/SW Contact:    Gala Lewandowsky, RN Phone Number: 07/13/2020, 2:46 PM  Clinical Narrative: Case Manager spoke with patient and she wants to return to daughters Home- [address] [place] [address]. Patient did not have a preference for agency. Case Manager made several calls to agencies and [facility] is willing to accept the patient. Case Manager spoke with Intake Claris Che. Start of care to begin within 24-48 hours post transition home. Durable medical equipment ordered via [employer] to be delivered to the room prior to transition home. Daughter to provide the patient transportation home via private vehicle. No further needs from Case Manager at this time.                Expected Discharge Plan: Home w Home Health Services Barriers to Discharge: No Barriers Identified   Patient Goals and CMS Choice Patient states their goals for this hospitalization and ongoing recovery are:: to return home with home health services.   Choice offered to / list presented to : NA (Patient did not have a choice. Having to call around to several agencies for [facility] Serivces 2/2 Medicaid)  Expected Discharge Plan and Services Expected Discharge Plan: Home w Home Health Services In-house Referral: NA Discharge Planning Services: CM Consult Post Acute Care Choice: Home Health Living arrangements for the past 2 months: Single Family Home Expected Discharge Date: 07/13/20               DME Arranged: High strength lightweight manual wheelchair with seat cushion, Bedside commode, Shower stool DME Agency: [employer] Date DME Agency Contacted: 07/13/20 Time DME Agency Contacted: 1245 Representative spoke with at DME Agency: Zack HH Arranged: PT HH Agency: [facility] Date HH Agency Contacted: 07/13/20 Time HH Agency Contacted:  1445 Representative spoke with at [facility] Agency: Claris Che  Prior Living Arrangements/Services Living arrangements for the past 2 months: Single Family Home Lives with:: Self (patient will return home with daughter at [address] [place] [address]) Patient language and need for interpreter reviewed:: Yes Do you feel safe going back to the place where you live?: Yes      Need for Family Participation in Patient Care: Yes (Comment) Care giver support system in place?: Yes (comment)   Criminal Activity/Legal Involvement Pertinent to Current Situation/Hospitalization: No - Comment as needed  Activities of Daily Living Home Assistive Devices/Equipment: Walker (specify type) ADL Screening (condition at time of admission) Patient's cognitive ability adequate to safely complete daily activities?: Yes Is the patient deaf or have difficulty hearing?: No Does the patient have difficulty seeing, even when wearing glasses/contacts?: No Does the patient have difficulty concentrating, remembering, or making decisions?: No Patient able to express need for assistance with ADLs?: Yes Does the patient have difficulty dressing or bathing?: Yes Independently performs ADLs?: No Does the patient have difficulty walking or climbing stairs?: Yes Weakness of Legs: Both Weakness of Arms/Hands: None  Permission Sought/Granted Permission sought to share information with : Family Supports, Magazine features editor, Case Estate manager/land agent granted to share information with : Yes, Verbal Permission Granted     Permission granted to share info w AGENCY: [facility]        Emotional Assessment Appearance:: Appears stated age Attitude/Demeanor/Rapport: Engaged Affect (typically observed): Accepting Orientation: : Oriented to Self, Oriented to Place, Oriented to  Time, Oriented to Situation Alcohol /  Substance Use: Not Applicable Psych Involvement: No (comment)  Admission diagnosis:  Status  post lumbar laminectomy [Z98.890] Patient Active Problem List   Diagnosis Date Noted  . Status post lumbar laminectomy 07/11/2020  . HNP (herniated nucleus pulposus), cervical 12/08/2012    Class: Chronic  . Cervical spondylosis without myelopathy 12/08/2012    Class: Chronic   PCP:  Pcp, No Pharmacy:   [facility] - [place], [place] - [address] [place] [address] Phone: [phone] Fax: [phone]   Readmission Risk Interventions No flowsheet data found.

## 2020-07-13 NOTE — Progress Notes (Signed)
     Subjective: 2 Days Post-Op Procedure(s) (LRB): LEFT LUMBAR THREE THROUGH LUMBAR FOUR HEMILAMINECTOMY WITH LEFT LUMBAR TWO THROUGH LUMBAR THREE  AND LUMBAR THREE THROUGH LUMBAR FOUR  EXCISION OF EXTRUDED DISC HERNIATION (N/A) Awake, alert and oriented x 4. Some bloating, receiving mylicon. Legs feel better left knee extension weakness.  Patient reports pain as moderate.    Objective:   VITALS:  Temp:  [97.3 F (36.3 C)-99 F (37.2 C)] 98.2 F (36.8 C) (12/05 0729) Pulse Rate:  [88-102] 102 (12/05 0729) Resp:  [13-17] 17 (12/05 0729) BP: (122-138)/(58-87) 138/87 (12/05 0729) SpO2:  [93 %-96 %] 95 % (12/05 0729)  Neurologically intact ABD soft Neurovascular intact Sensation intact distally Intact pulses distally Dorsiflexion/Plantar flexion intact Incision: moderate drainage and Dressing changed. Left knee weakness. No cellulitis present Compartment soft   LABS Recent Labs    07/12/20 0140  HGB 12.6  WBC 11.1*  PLT 276   Recent Labs    07/12/20 0140  NA 141  K 3.8  CL 102  CO2 25  BUN 8  CREATININE 0.92  GLUCOSE 136*   No results for input(s): LABPT, INR in the last 72 hours.   Assessment/Plan: 2 Days Post-Op Procedure(s) (LRB): LEFT LUMBAR THREE THROUGH LUMBAR FOUR HEMILAMINECTOMY WITH LEFT LUMBAR TWO THROUGH LUMBAR THREE  AND LUMBAR THREE THROUGH LUMBAR FOUR  EXCISION OF EXTRUDED DISC HERNIATION (N/A)  Advance diet Up with therapy D/C IV fluids Discharge home with home health  Vira Browns 07/13/2020, 11:39 AMPatient ID: Tammy Li, female   DOB: 1965-10-28, 54 y.o.   MRN: [id number]

## 2020-07-13 NOTE — Plan of Care (Signed)
  Problem: Health Behavior/Discharge Planning: Goal: Ability to manage health-related needs will improve Outcome: Progressing   Problem: Activity: Goal: Risk for activity intolerance will decrease Outcome: Progressing   Problem: Pain Managment: Goal: General experience of comfort will improve Outcome: Adequate for Discharge   Problem: Safety: Goal: Ability to remain free from injury will improve Outcome: Progressing

## 2020-07-13 NOTE — Progress Notes (Signed)
Physical Therapy Treatment Patient Details Name: Tammy Li MRN: [id number] DOB: [birth year] Today's Date: 07/13/2020    History of Present Illness  54 year old female history of left L2-3 HNP low back pain and left lower extremity radiculopathy comes in for preop evaluation.  States that symptoms unchanged from previous visit and she is wanting to proceed with left L3-4 hemilaminectomy with left L2-3 and L3-4 excision of extruded disc herniation. PMH: Cerebral palsy, GERD, arthritis.     PT Comments    Patient received up in recliner, agrees to PT session. Reports she is feeling better today. Reports moderate pain. Performed sit to stand with min guard. Cues for safety with transfers, to get fully turned and back up to seated surface. Ambulated 100 feet with RW and min guard. Cues to stay close to AD. Required seated rest prior to ambulating back to room. Ambulated up/down 2 steps with B rails min guard. She will continue to benefit from skilled PT while here for improved strengthening and functional independence.        Follow Up Recommendations  Home health PT;Supervision for mobility/OOB;Supervision - Intermittent     Equipment Recommendations  None recommended by PT    Recommendations for Other Services       Precautions / Restrictions Precautions Precautions: Fall;Back Precaution Comments: Per orders, no brace needed Restrictions Weight Bearing Restrictions: No    Mobility  Bed Mobility               General bed mobility comments: Patient up in recliner  Transfers Overall transfer level: Needs assistance Equipment used: Rolling walker (2 wheeled) Transfers: Sit to/from Stand Sit to Stand: Supervision;Min guard Stand pivot transfers: Min assist       General transfer comment: able to ambulate to b.room today with no buckling of L knee noted.  safe pivots and use of hand placement without cues  Ambulation/Gait Ambulation/Gait assistance: Min guard Gait  Distance (Feet): 100 Feet Assistive device: Rolling walker (2 wheeled) Gait Pattern/deviations: Step-to pattern;Shuffle;Trunk flexed Gait velocity: decreased    General Gait Details: slow pace, cues to get closer to RW for support   Stairs Stairs: Yes Stairs assistance: Min guard Stair Management: Two rails;Step to pattern Number of Stairs: 2 General stair comments: patient safe with stair mobility this day.   Wheelchair Mobility    Modified Rankin (Stroke Patients Only)       Balance Overall balance assessment: Needs assistance;History of Falls Sitting-balance support: Feet supported Sitting balance-Leahy Scale: Good     Standing balance support: Bilateral upper extremity supported;During functional activity Standing balance-Leahy Scale: Fair Standing balance comment: reliant on UE support, supervision/min guard                            Cognition Arousal/Alertness: Awake/alert Behavior During Therapy: WFL for tasks assessed/performed Overall Cognitive Status: Within Functional Limits for tasks assessed Area of Impairment: Safety/judgement                     Memory: Decreased recall of precautions         General Comments: Patent wilth limited recall of back percautions. She was pleasent and followed all commands       Exercises Total Joint Exercises Ankle Circles/Pumps: AAROM;Both;10 reps Quad Sets: AROM;Both;10 reps Hip ABduction/ADduction: AAROM;Both;10 reps Long Arc Quad: AAROM;Both;10 reps    General Comments        Pertinent Vitals/Pain Pain Assessment: Faces Pain Score: 6  Faces  Pain Scale: Hurts little more Pain Location: low back- constant 6/10 Pain Descriptors / Indicators: Discomfort;Constant Pain Intervention(s): Limited activity within patient's tolerance;Monitored during session;Repositioned;Premedicated before session    Home Living                      Prior Function            PT Goals (current  goals can now be found in the care plan section) Acute Rehab PT Goals Patient Stated Goal: go home  PT Goal Formulation: With patient Time For Goal Achievement: 07/19/20 Potential to Achieve Goals: Good Progress towards PT goals: Progressing toward goals    Frequency    Min 5X/week      PT Plan Current plan remains appropriate    Co-evaluation              AM-PAC PT "6 Clicks" Mobility   Outcome Measure  Help needed turning from your back to your side while in a flat bed without using bedrails?: A Little Help needed moving from lying on your back to sitting on the side of a flat bed without using bedrails?: A Little Help needed moving to and from a bed to a chair (including a wheelchair)?: A Little Help needed standing up from a chair using your arms (e.g., wheelchair or bedside chair)?: A Little Help needed to walk in hospital room?: A Little Help needed climbing 3-5 steps with a railing? : A Little 6 Click Score: 18    End of Session Equipment Utilized During Treatment: Gait belt Activity Tolerance: Patient tolerated treatment well Patient left: in chair;with call bell/phone within reach Nurse Communication: Mobility status PT Visit Diagnosis: Other abnormalities of gait and mobility (R26.89);Muscle weakness (generalized) (M62.81);Pain;History of falling (Z91.81);Difficulty in walking, not elsewhere classified (R26.2) Pain - part of body:  (back)     Time: 1130-1155 PT Time Calculation (min) (ACUTE ONLY): 25 min  Charges:  $Gait Training: 8-22 mins $Therapeutic Exercise: 8-22 mins                     Jeanean Hollett, PT, GCS 07/13/20,12:04 PM

## 2020-07-13 NOTE — Care Management (Cosign Needed)
    Durable Medical Equipment  (From admission, onward)         Start     Ordered   07/13/20 1239  For home use only DME lightweight manual wheelchair with seat cushion  Once       Comments: Patient suffers from status post lumbar surgery which impairs their ability to perform daily activities like ambulating safely in the home.  A rolling walker will not resolve  issue with performing activities of daily living. A wheelchair will allow patient to safely perform daily activities. Patient is not able to propel themselves in the home using a standard weight wheelchair due to weakness. Patient can self propel in the lightweight wheelchair. Length of need lifetime. Accessories: elevating leg rests (ELRs), wheel locks, extensions and anti-tippers.   07/13/20 1239   07/13/20 1234  For home use only DME Shower stool  Once        07/13/20 1234   07/11/20 1821  DME Walker rolling  Once       Question Answer Comment  Walker: With 5 Inch Wheels   Patient needs a walker to treat with the following condition Weakness of left leg   Patient needs a walker to treat with the following condition Status post lumbar laminectomy      07/11/20 1820   07/11/20 1821  DME 3 n 1  Once        07/11/20 1820

## 2020-07-13 NOTE — Progress Notes (Signed)
Pt given discharge instructions and gone over with her, she verbalized understanding. Wheelchair and 3n1 delivered to room. All belongings gathered to be sent home. Pt's daughter to pick her up.

## 2020-07-14 ENCOUNTER — Telehealth: Payer: Self-pay | Admitting: Specialist

## 2020-07-14 ENCOUNTER — Encounter (HOSPITAL_COMMUNITY): Payer: Self-pay | Admitting: Specialist

## 2020-07-14 NOTE — Anesthesia Postprocedure Evaluation (Signed)
Anesthesia Post Note  Patient: Charity fundraiser  Procedure(s) Performed: LEFT LUMBAR THREE THROUGH LUMBAR FOUR HEMILAMINECTOMY WITH LEFT LUMBAR TWO THROUGH LUMBAR THREE  AND LUMBAR THREE THROUGH LUMBAR FOUR  EXCISION OF EXTRUDED DISC HERNIATION (N/A )     Patient location during evaluation: PACU Anesthesia Type: General Level of consciousness: awake and alert Pain management: pain level controlled Vital Signs Assessment: post-procedure vital signs reviewed and stable Respiratory status: spontaneous breathing, nonlabored ventilation, respiratory function stable and patient connected to nasal cannula oxygen Cardiovascular status: blood pressure returned to baseline and stable Postop Assessment: no apparent nausea or vomiting Anesthetic complications: no   No complications documented.  Last Vitals:  Vitals:   07/13/20 0305 07/13/20 0729  BP: 125/82 138/87  Pulse: 88 (!) 102  Resp: 15 17  Temp: 36.7 C 36.8 C  SpO2: 95% 95%    Last Pain:  Vitals:   07/13/20 1740  TempSrc:   PainSc: 2                  Tonae Livolsi

## 2020-07-14 NOTE — Telephone Encounter (Signed)
I called, Julie states that the patient is going to take the rx that her PCP gives her regularly, and Julie deactivated what was given by Dr. Nitka, as far as Miralax she will take OTC  

## 2020-07-14 NOTE — Telephone Encounter (Signed)
Patient called requesting for Neysa Bonito to call [employer]. Patient states the pharmacy will not release her medication to her. Patient states she is unsure of the new medications that she need but the pharmacy stated to her that we needed to contact them for approval. Please call patient at [phone].

## 2020-07-14 NOTE — Telephone Encounter (Signed)
I called, Raynelle Fanning states that the patient is going to take the rx that her PCP gives her regularly, and Raynelle Fanning deactivated what was given by Dr. [name], as far as Miralax she will take OTC

## 2020-07-14 NOTE — Telephone Encounter (Signed)
Received call from Julie-pharmacist advised do not have the packets of Miralax will have to order them. Raynelle Fanning also said she filled Rx for 120 Oxycodone 15 mg on 06/25/2020 -instructions to take 1 every 6 hours    The number to contact Raynelle Fanning is [phone]

## 2020-07-15 ENCOUNTER — Telehealth: Payer: Self-pay | Admitting: Specialist

## 2020-07-15 MED FILL — Thrombin (Recombinant) For Soln 20000 Unit: CUTANEOUS | Qty: 1 | Status: AC

## 2020-07-15 NOTE — Telephone Encounter (Signed)
June called requesting verbal orders for pt  twice a week for five weeks  strenthing activities  Gait training  safety and functioning mobility

## 2020-07-16 NOTE — Telephone Encounter (Signed)
I called and gave verbal orders for PT Tammy Li

## 2020-07-25 DIAGNOSIS — M5116 Intervertebral disc disorders with radiculopathy, lumbar region: Secondary | ICD-10-CM

## 2020-07-31 ENCOUNTER — Ambulatory Visit (INDEPENDENT_AMBULATORY_CARE_PROVIDER_SITE_OTHER): Payer: Medicaid Other | Admitting: Specialist

## 2020-07-31 ENCOUNTER — Encounter: Payer: Self-pay | Admitting: Specialist

## 2020-07-31 VITALS — BP 134/82 | HR 102 | Ht 66.0 in | Wt 229.0 lb

## 2020-07-31 DIAGNOSIS — R29898 Other symptoms and signs involving the musculoskeletal system: Secondary | ICD-10-CM

## 2020-07-31 DIAGNOSIS — Z9889 Other specified postprocedural states: Secondary | ICD-10-CM

## 2020-07-31 DIAGNOSIS — M5416 Radiculopathy, lumbar region: Secondary | ICD-10-CM

## 2020-07-31 NOTE — Progress Notes (Signed)
Post-Op Visit Note   Patient: Tammy Li           Date of Birth: July 11, 1966           MRN: [id number] Visit Date: 07/31/2020 PCP: Pcp, No   Assessment & Plan: 3 weeks post left  L3-4 hemilaminectomy for HNP for large HNP with extrusion from L2-3 down to L3-4.   Chief Complaint:  Chief Complaint  Patient presents with  . Lower Back - Routine Post Op  Incision is healed. Legs: Motor is intact though 1/2 grade weakness Staples removed today. Receiving meds from pain management.  Visit Diagnoses:  1. Proximal leg weakness   2. Radiculopathy, lumbar region   3. Status post lumbar laminectomy     Plan: Avoid frequent bending and stooping  No lifting greater than 10 lbs. May use ice or moist heat for pain. Weight loss is of benefit. Best medication for lumbar disc disease is arthritis medications like motrin, celebrex and naprosyn. Exercise is important to improve your indurance and does allow people to function better inspite of back pain.    Follow-Up Instructions: No follow-ups on file.   Orders:  No orders of the defined types were placed in this encounter.  No orders of the defined types were placed in this encounter.   Imaging: No results found.  PMFS History: Patient Active Problem List   Diagnosis Date Noted  . HNP (herniated nucleus pulposus), cervical 12/08/2012    Priority: High    Class: Chronic  . Cervical spondylosis without myelopathy 12/08/2012    Priority: High    Class: Chronic  . Herniation of lumbar intervertebral disc with radiculopathy   . Status post lumbar laminectomy 07/11/2020   Past Medical History:  Diagnosis Date  . Arthritis    generalized  . Cerebral palsy (HCC)    since birth  . Claustrophobia   . CP (cerebral palsy) (HCC)   . Dyspnea   . Family history of adverse reaction to anesthesia    daughter wakes up during anesthesia  . GERD (gastroesophageal reflux disease)   . Headache(784.0)   . Neuromuscular disorder  (HCC)    cp  . Seasonal allergies     History reviewed. No pertinent family history.  Past Surgical History:  Procedure Laterality Date  . BACK SURGERY    . DIAGNOSTIC LAPAROSCOPY     surgery for endometriosis  . HAND SURGERY Left    cyst  . KNEE ARTHROSCOPY Right   . LEG SURGERY Bilateral    heel cord lengthening  rt and lf plus 2 other   . LUMBAR LAMINECTOMY/DECOMPRESSION MICRODISCECTOMY N/A 07/11/2020   Procedure: LEFT LUMBAR THREE THROUGH LUMBAR FOUR HEMILAMINECTOMY WITH LEFT LUMBAR TWO THROUGH LUMBAR THREE  AND LUMBAR THREE THROUGH LUMBAR FOUR  EXCISION OF EXTRUDED DISC HERNIATION;  Surgeon: [name], MD;  Location: [facility] OR;  Service: Orthopedics;  Laterality: N/A;  . POSTERIOR CERVICAL FUSION/FORAMINOTOMY N/A 12/08/2012   Procedure: Left C3-4 and C6-7 foraminotomy with excision of HNP;  Surgeon: [name], MD;  Location: [facility] OR;  Service: Orthopedics;  Laterality: N/A;  . TUBAL LIGATION     Social History   Occupational History  . Not on file  Tobacco Use  . Smoking status: Current Every Day Smoker    Packs/day: 0.25    Years: 20.00    Pack years: 5.00    Types: Cigarettes  . Smokeless tobacco: Never Used  . Tobacco comment: stopped smoking at age 32  Vaping  Use  . Vaping Use: Never used  Substance and Sexual Activity  . Alcohol use: No  . Drug use: Never    Types: IV  . Sexual activity: Not on file

## 2020-07-31 NOTE — Patient Instructions (Signed)
Avoid frequent bending and stooping  No lifting greater than 10 lbs. May use ice or moist heat for pain. Weight loss is of benefit. Best medication for lumbar disc disease is arthritis medications like motrin, celebrex and naprosyn. Exercise is important to improve your indurance and does allow people to function better inspite of back pain.   

## 2020-08-15 NOTE — Discharge Summary (Signed)
Patient ID: Tammy Li MRN: [id number] DOB/AGE: 1965-11-04 55 y.o.  Admit date: 07/11/2020 Discharge date: 07/13/2020 Admission Diagnoses:  Active Problems:   Status post lumbar laminectomy   Herniation of lumbar intervertebral disc with radiculopathy   Discharge Diagnoses:  Active Problems:   Status post lumbar laminectomy   Herniation of lumbar intervertebral disc with radiculopathy  status post Procedure(s): LEFT LUMBAR THREE THROUGH LUMBAR FOUR HEMILAMINECTOMY WITH LEFT LUMBAR TWO THROUGH LUMBAR THREE  AND LUMBAR THREE THROUGH LUMBAR FOUR  EXCISION OF EXTRUDED [place] HERNIATION  Past Medical History:  Diagnosis Date  . Arthritis    generalized  . 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 Claustrophobia   . CP (cerebral palsy) ([place])   . Dyspnea   . Family history of adverse reaction to anesthesia    daughter wakes up during anesthesia  . GERD (gastroesophageal reflux disease)   . Headache(784.0)   . Neuromuscular disorder ([place])    cp  . Seasonal allergies     Surgeries: Procedure(s): LEFT LUMBAR THREE THROUGH LUMBAR FOUR HEMILAMINECTOMY WITH LEFT LUMBAR TWO THROUGH LUMBAR THREE  AND LUMBAR THREE THROUGH LUMBAR FOUR  EXCISION OF EXTRUDED [place] on 07/11/2020   Consultants:   Discharged Condition: Improved  Hospital Course: Tammy Li is an 55 y.o. female who was admitted 07/11/2020 for operative treatment of lumbar stenosis. Patient failed conservative treatments (please see the history and physical for the specifics) and had severe unremitting pain that affects sleep, daily activities and work/hobbies. After pre-op clearance, the patient was taken to the operating room on 07/11/2020 and underwent  Procedure(s): LEFT LUMBAR THREE THROUGH LUMBAR FOUR HEMILAMINECTOMY WITH LEFT LUMBAR TWO THROUGH LUMBAR THREE  AND LUMBAR THREE THROUGH LUMBAR FOUR  EXCISION OF EXTRUDED [place].    Patient was given perioperative antibiotics:  Anti-infectives (From  admission, onward)   Start     Dose/Rate Route Frequency Ordered Stop   07/11/20 1900  ceFAZolin (ANCEF) IVPB 2g/100 mL premix        2 g 200 mL/hr over 30 Minutes Intravenous Every 8 hours 07/11/20 1820 07/12/20 0356   07/11/20 1045  ceFAZolin (ANCEF) IVPB 2g/100 mL premix        2 g 200 mL/hr over 30 Minutes Intravenous On call to O.R. 07/11/20 1039 07/11/20 1306       Patient was given sequential compression devices and early ambulation to prevent DVT.   Patient benefited maximally from hospital stay and there were no complications. At the time of discharge, the patient was urinating/moving their bowels without difficulty, tolerating a regular diet, pain is controlled with oral pain medications and they have been cleared by PT/OT.   Recent vital signs: No data found.   Recent laboratory studies: No results for input(s): WBC, HGB, HCT, PLT, NA, K, CL, CO2, BUN, CREATININE, GLUCOSE, INR, CALCIUM in the last 72 hours.  Invalid input(s): PT, 2   Discharge Medications:   Allergies as of 07/13/2020      Reactions   Sulfa Antibiotics Nausea And Vomiting   Shellfish-derived Products Nausea And Vomiting, Rash      Medication List    TAKE these medications   atorvastatin 40 MG tablet Commonly known as: LIPITOR Take 40 mg by mouth daily.   calcium carbonate 500 MG chewable tablet Commonly known as: TUMS - dosed in mg elemental calcium Chew 2-3 tablets by mouth every 8 (eight) hours as needed for indigestion or heartburn.   diazepam 10 MG tablet Commonly known as:  VALIUM Take 10 mg by mouth 2 (two) times daily as needed for anxiety.   dicyclomine 20 MG tablet Commonly known as: BENTYL Take 20 mg by mouth 3 (three) times daily as needed for cramping.   docusate sodium 100 MG capsule Commonly known as: COLACE Take 1 capsule (100 mg total) by mouth 2 (two) times daily.   fluticasone 50 MCG/ACT nasal spray Commonly known as: FLONASE Place 1 spray into both nostrils daily as  needed for allergies.   furosemide 40 MG tablet Commonly known as: LASIX Take 40 mg by mouth daily.   gabapentin 300 MG capsule Commonly known as: NEURONTIN Take 1 capsule (300 mg total) by mouth 3 (three) times daily.   IBU 800 MG tablet Generic drug: ibuprofen Take 800 mg by mouth 3 (three) times daily as needed for pain. What changed: Another medication with the same name was added. Make sure you understand how and when to take each.   ibuprofen 800 MG tablet Commonly known as: ADVIL Take 1 tablet (800 mg total) by mouth 3 (three) times daily. What changed: You were already taking a medication with the same name, and this prescription was added. Make sure you understand how and when to take each.   loratadine 10 MG tablet Commonly known as: CLARITIN Take 10 mg by mouth daily.   methocarbamol 500 MG tablet Commonly known as: ROBAXIN Take 500 mg by mouth 4 (four) times daily as needed for muscle spasms. What changed: Another medication with the same name was added. Make sure you understand how and when to take each.   methocarbamol 500 MG tablet Commonly known as: ROBAXIN Take 1 tablet (500 mg total) by mouth every 6 (six) hours as needed for muscle spasms. What changed: You were already taking a medication with the same name, and this prescription was added. Make sure you understand how and when to take each.   multivitamin with minerals Tabs tablet Take 1 tablet by mouth daily.   Myrbetriq 50 MG Tb24 tablet Generic drug: mirabegron ER Take 50 mg by mouth at bedtime.   omeprazole 40 MG capsule Commonly known as: PRILOSEC Take 40 mg by mouth daily as needed for heartburn.   Oxycodone HCl 10 MG Tabs Take 1 tablet (10 mg total) by mouth every 3 (three) hours as needed for severe pain ((score 7 to 10)). What changed:   medication strength  how much to take  when to take this  reasons to take this   polyethylene glycol 17 g packet Commonly known as: MIRALAX /  GLYCOLAX Take 17 g by mouth daily as needed for mild constipation.   ProAir HFA 108 (90 Base) MCG/ACT inhaler Generic drug: albuterol Inhale 2 puffs into the lungs every 6 (six) hours as needed for shortness of breath or wheezing.   simethicone 80 MG chewable tablet Commonly known as: MYLICON Chew 1 tablet (80 mg total) by mouth every 6 (six) hours as needed for flatulence (stomach bloating).   vitamin C 1000 MG tablet Take 1,000 mg by mouth daily.   Vitamin D-3 125 MCG (5000 UT) Tabs Take 5,000 Units by mouth once a week.       Diagnostic Studies: No results found.  Discharge Instructions    Call MD / Call 911   Complete by: As directed    If you experience chest pain or shortness of breath, CALL 911 and be transported to the hospital emergency room.  If you develope a fever above 101 F, pus (  white drainage) or increased drainage or redness at the wound, or calf pain, call your surgeon's office.   Constipation Prevention   Complete by: As directed    Drink plenty of fluids.  Prune juice may be helpful.  You may use a stool softener, such as Colace (over the counter) 100 mg twice a day.  Use MiraLax (over the counter) for constipation as needed.   Diet - low sodium heart healthy   Complete by: As directed    Discharge instructions   Complete by: As directed    No lifting greater than 10 lbs. Avoid bending, stooping and twisting. Walk in house for first week them may start to get out slowly increasing distance up to one mile by 3 weeks post op. Keep incision dry for 3 days, may use tegaderm or similar water impervious dressing.   Driving restrictions   Complete by: As directed    No driving.   Increase activity slowly as tolerated   Complete by: As directed    Lifting restrictions   Complete by: As directed    No lifting for 6 weeks       Follow-up Information    [name], MD In 2 weeks.   Specialty: Orthopedic Surgery Why: For wound re-check Contact  information: [address] [place] [address] [phone]        [facility] Follow up.   Why: Wheelchair, Bedside Commode, Shower chair-equipment will be delivered to the room prior to transition home.  Contact information: [address] [place] [address] [phone]        [facility] Follow up.   Specialty: Home Health Services Why: Home Health Physical Therapy-office to call with visit times.  Contact information: [address] [place] [address] [phone]               Discharge Plan:  discharge to home  Disposition:     Signed: Zonia Kief  08/15/2020, 10:43 AM

## 2020-08-28 ENCOUNTER — Ambulatory Visit: Payer: Medicaid Other | Admitting: Specialist

## 2021-03-27 ENCOUNTER — Telehealth: Payer: Self-pay | Admitting: Specialist

## 2021-03-27 NOTE — Telephone Encounter (Signed)
[facility] called wondering if you have received the billing form from medicaid and if you can fax the form back to them?   CB [phone] ex [phone]

## 2021-03-30 NOTE — Telephone Encounter (Signed)
I have refaxed this per Francis Dowse B.

## 2021-07-10 ENCOUNTER — Other Ambulatory Visit: Payer: Self-pay

## 2021-07-10 ENCOUNTER — Ambulatory Visit: Payer: Medicaid Other | Admitting: Specialist

## 2021-07-10 ENCOUNTER — Ambulatory Visit (INDEPENDENT_AMBULATORY_CARE_PROVIDER_SITE_OTHER): Payer: Medicaid Other

## 2021-07-10 ENCOUNTER — Encounter: Payer: Self-pay | Admitting: Specialist

## 2021-07-10 VITALS — BP 157/89 | HR 96 | Ht 66.0 in | Wt 229.0 lb

## 2021-07-10 DIAGNOSIS — R29898 Other symptoms and signs involving the musculoskeletal system: Secondary | ICD-10-CM

## 2021-07-10 DIAGNOSIS — Z9889 Other specified postprocedural states: Secondary | ICD-10-CM

## 2021-07-10 DIAGNOSIS — Z4889 Encounter for other specified surgical aftercare: Secondary | ICD-10-CM

## 2021-07-10 DIAGNOSIS — Z981 Arthrodesis status: Secondary | ICD-10-CM | POA: Diagnosis not present

## 2021-07-10 MED ORDER — METHYLPREDNISOLONE 4 MG PO TBPK: ORAL_TABLET | ORAL | 0 refills | Status: AC

## 2021-07-10 NOTE — Patient Instructions (Signed)
Plan: Avoid frequent bending and stooping  No lifting greater than 10 lbs. May use ice or moist heat for pain. Weight loss is of benefit. MRI of the lumbar spine with and without contrast ordered.  Exercise is important to improve your indurance and does allow people to function better inspite of back pain. Stop use of ibuprofen while taking medrol dose pak.

## 2021-07-10 NOTE — Progress Notes (Addendum)
Office Visit Note   Patient: Tammy Li           Date of Birth: 07-Sep-1965           MRN: [id number] Visit Date: 07/10/2021              Requested by: No referring provider defined for this encounter. PCP: Pcp, No   Assessment & Plan: Visit Diagnoses:  1. Status post lumbar laminectomy   2. Encounter for other specified surgical aftercare   3. Weakness of both hips   4. History of lumbar spinal fusion     Plan: Avoid frequent bending and stooping  No lifting greater than 10 lbs. May use ice or moist heat for pain. Weight loss is of benefit. MRI of the lumbar spine with and without contrast ordered.  Exercise is important to improve your indurance and does allow people to function better inspite of back pain.  Stop use of ibuprofen while taking medrol dose pak.   Follow-Up Instructions: Return in about 3 weeks (around 07/31/2021).   Orders:  Orders Placed This Encounter  Procedures   XR Lumbar Spine 2-3 Views   MR Lumbar Spine W Wo Contrast   Meds ordered this encounter  Medications   methylPREDNISolone (MEDROL DOSEPAK) 4 MG TBPK tablet    Sig: Take six day dose pak as directed    Dispense:  21 tablet    Refill:  0      Procedures: No procedures performed   Clinical Data: No additional findings.   Subjective: Chief Complaint  Patient presents with   Lower Back - Pain    55 year old female with history of L1-2 TLIF for retrolisthesis and central HNP L1-2 in 2008, previous cervical foramenotomies in 2014. Last year 07/11/2021 she underwent left L2-3 and L3-4 hemilaminectomies with excision of an extruded HNP. Since then she has done fairly well, reports she was having less pain till Thanksgiving day she reports awakening with increased pain and the day following the pain was severe and she had difficulty getting out of bed. Was seen in the [facility] ER.  She was given steroids and flexeril and pain meds ibuprofen and she takes oxycodone for  chronic pain.  The oxycodone is from Dr. Servando Salina.  She is having normal bowel or bladder. Her pain is mainly back left side and into the left buttock and is shifing move to the left side.    Review of Systems   Objective: Vital Signs: BP (!) 157/89 (BP Location: Left Arm, Patient Position: Sitting)   Pulse 96   Ht 5\' 6"  (1.676 m)   Wt 229 lb (103.9 kg)   BMI 36.96 kg/m   Physical Exam Constitutional:      Appearance: She is well-developed.  HENT:     Head: Normocephalic and atraumatic.  Eyes:     Pupils: Pupils are equal, round, and reactive to light.  Pulmonary:     Effort: Pulmonary effort is normal.     Breath sounds: Normal breath sounds.  Abdominal:     General: Bowel sounds are normal.     Palpations: Abdomen is soft.  Musculoskeletal:     Cervical back: Normal range of motion and neck supple.  Skin:    General: Skin is warm and dry.  Neurological:     Mental Status: She is alert and oriented to person, place, and time.  Psychiatric:        Behavior: Behavior normal.  Thought Content: Thought content normal.        Judgment: Judgment normal.    Back Exam   Tenderness  The patient is experiencing tenderness in the lumbar.  Range of Motion  Extension:  abnormal  Flexion:  abnormal  Lateral bend right:  abnormal  Lateral bend left:  abnormal  Rotation right:  abnormal  Rotation left:  abnormal   Muscle Strength  Right Quadriceps:  5/5  Left Quadriceps:  5/5  Right Hamstrings:  5/5  Left Hamstrings:  5/5   Reflexes  Patellar:  2/4 Achilles:  2/4  Comments:  Bilateral hip flexion weakness,  Knee extension (Quad) strength is normal and foot DF is intact She has scissoring of the leg and hip IR contractures and bilateral knee flexion contractures with tight hamstrings.  Complains of left groin pain.      Specialty Comments:  No specialty comments available.  Imaging: XR Lumbar Spine 2-3 Views  Result Date: 07/10/2021 Ap and  lateral flexion and extension radiographs show TLIF L1-2 in good position and alignment no loosening of the hardware. Minimal retrolisthesis of L2-3, disc heights are fairly well maintained. No acute fracture. No anterolisthesis. Mild right list of the thoracolumbar spine.     PMFS History: Patient Active Problem List   Diagnosis Date Noted   HNP (herniated nucleus pulposus), cervical 12/08/2012    Priority: High    Class: Chronic   Cervical spondylosis without myelopathy 12/08/2012    Priority: High    Class: Chronic   Herniation of lumbar intervertebral disc with radiculopathy    Status post lumbar laminectomy 07/11/2020   Past Medical History:  Diagnosis Date   Arthritis    generalized   Cerebral palsy ([facility])    since birth   Claustrophobia    CP (cerebral palsy) ([facility])    Dyspnea    Family history of adverse reaction to anesthesia    daughter wakes up during anesthesia   GERD (gastroesophageal reflux disease)    Headache(784.0)    Neuromuscular disorder ([facility])    cp   Seasonal allergies     History reviewed. No pertinent family history.  Past Surgical History:  Procedure Laterality Date   BACK SURGERY     DIAGNOSTIC LAPAROSCOPY     surgery for endometriosis   HAND SURGERY Left    cyst   KNEE ARTHROSCOPY Right    LEG SURGERY Bilateral    heel cord lengthening  rt and lf plus 2 other    LUMBAR LAMINECTOMY/DECOMPRESSION MICRODISCECTOMY N/A 07/11/2020   Procedure: LEFT LUMBAR THREE THROUGH LUMBAR FOUR HEMILAMINECTOMY WITH LEFT LUMBAR TWO THROUGH LUMBAR THREE  AND LUMBAR THREE THROUGH LUMBAR FOUR  EXCISION OF EXTRUDED [place] HERNIATION;  Surgeon: [name], MD;  Location: [facility] OR;  Service: Orthopedics;  Laterality: N/A;   POSTERIOR CERVICAL FUSION/FORAMINOTOMY N/A 12/08/2012   Procedure: Left C3-4 and C6-7 foraminotomy with excision of HNP;  Surgeon: [name], MD;  Location[facility];  Service: Orthopedics;  Laterality: N/A;   TUBAL LIGATION     Social History    Occupational History   Not on file  Tobacco Use   Smoking status: Every Day    Packs/day: 0.25    Years: 20.00    Pack years: 5.00    Types: Cigarettes   Smokeless tobacco: Never   Tobacco comments:    stopped smoking at age 17  Vaping Use   Vaping Use: Never used  Substance and Sexual Activity   Alcohol use: No  Drug use: Never    Types: IV   Sexual activity: Not on file

## 2021-07-18 ENCOUNTER — Ambulatory Visit (HOSPITAL_BASED_OUTPATIENT_CLINIC_OR_DEPARTMENT_OTHER)
Admission: RE | Admit: 2021-07-18 | Discharge: 2021-07-18 | Disposition: A | Discharge status: Home / Self Care | Payer: Medicaid Other | Source: Ambulatory Visit | Attending: Specialist | Admitting: Specialist

## 2021-07-18 ENCOUNTER — Other Ambulatory Visit: Payer: Self-pay

## 2021-07-18 DIAGNOSIS — Z4889 Encounter for other specified surgical aftercare: Secondary | ICD-10-CM | POA: Diagnosis not present

## 2021-07-18 IMAGING — MR MR LUMBAR SPINE WO/W CM
4 of 8 series · 22 of 48 positions shown · IV contrast (gadavist)
Comparison: Previous radiograph from [DATE] and MRI from
[DATE].

CLINICAL DATA: Initial evaluation for low back pain with new
symptoms, worsening hip flexion weakness. History of prior surgery.

EXAM:
MRI LUMBAR SPINE WITHOUT AND WITH CONTRAST
TECHNIQUE: Multiplanar and multiecho pulse sequences of the lumbar spine were
obtained without and with intravenous contrast.
CONTRAST:  10mL GADAVIST GADOBUTROL 1 MMOL/ML IV SOLN

[Series 2: T1 · sagittal · 4.0mm · 0.81mm/px · 3 of 16 slices shown (1 of 2)]
[im 1/16]
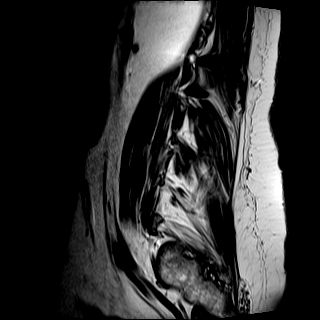
[im 8/16]
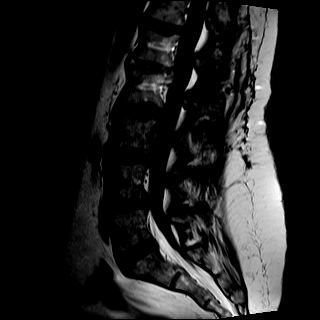
[im 16/16]
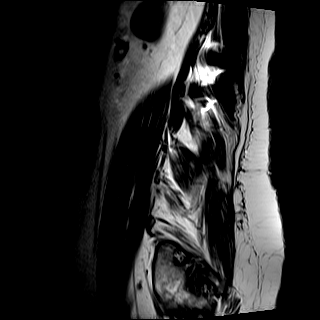

[Series 4: T2 · axial · 4.0mm · 0.39mm/px · z∈[-42,+178]mm · 8 of 45 slices shown (1 of 2)]
[im 1/45]
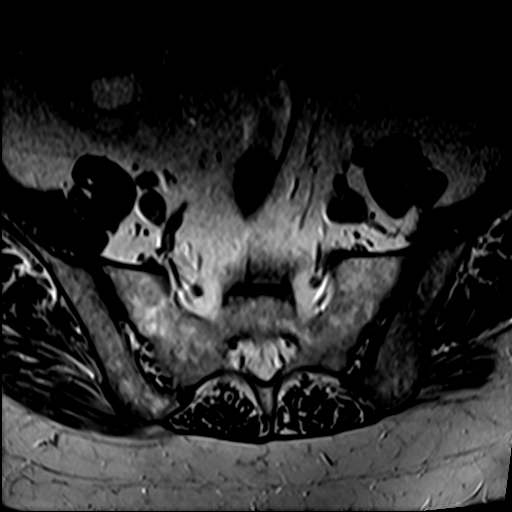
[im 5/45]
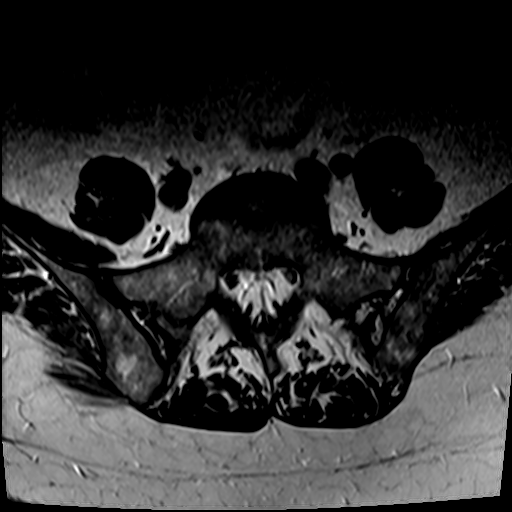
[im 15/45]
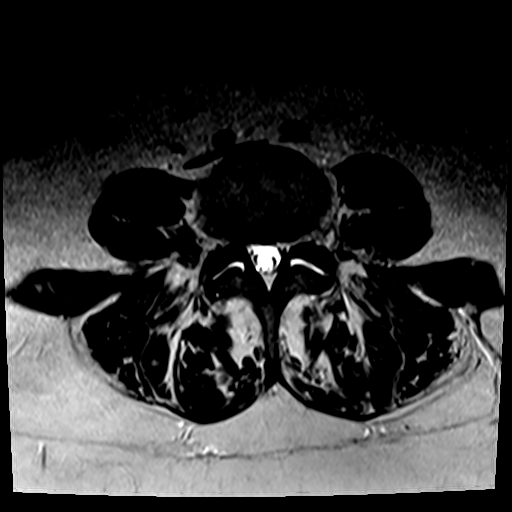
[im 20/45]
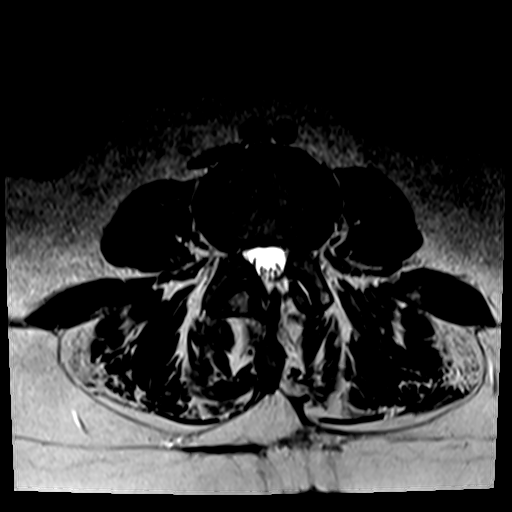
[im 25/45]
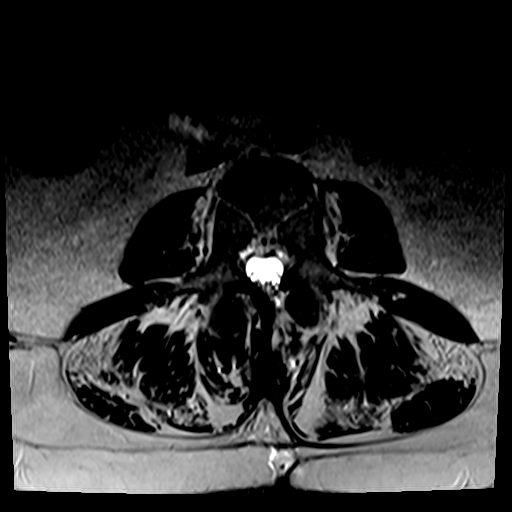
[im 30/45]
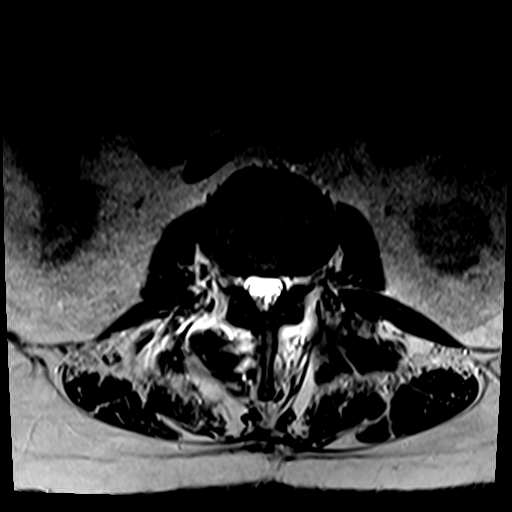
[im 40/45]
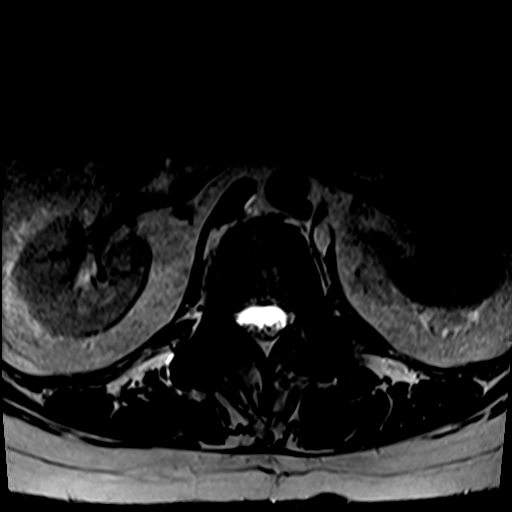
[im 45/45]
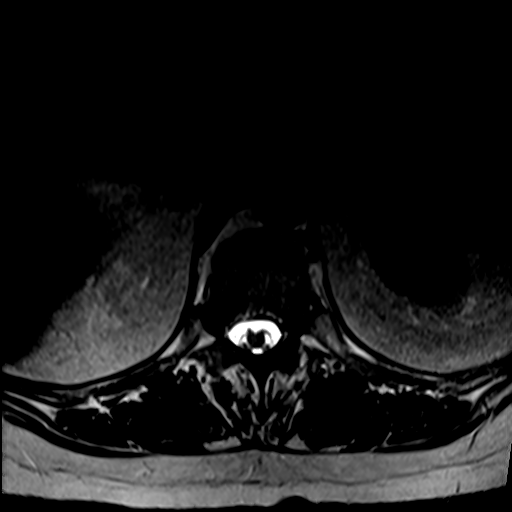

[Series 5: T1 · axial · 4.0mm · 0.39mm/px · z∈[-42,+153]mm · 7 of 45 slices shown (2 of 2)]
[im 1/45]
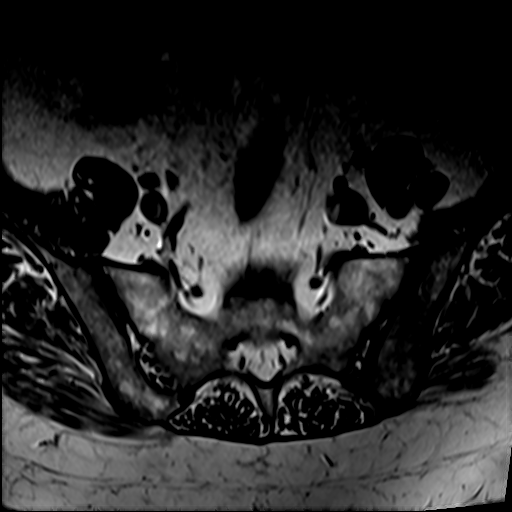
[im 5/45]
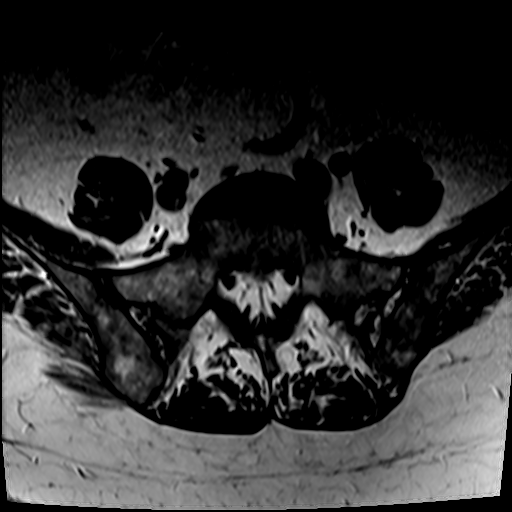
[im 15/45]
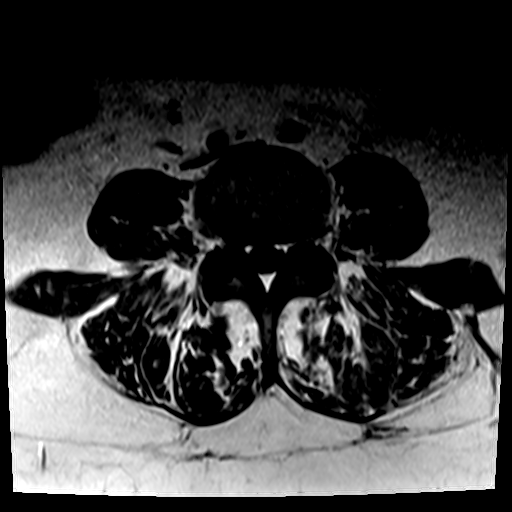
[im 20/45]
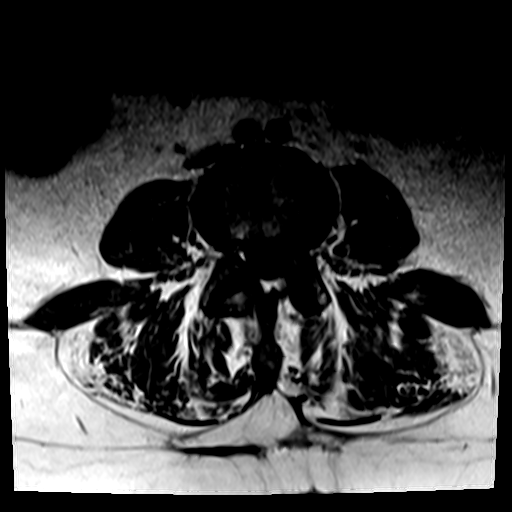
[im 25/45]
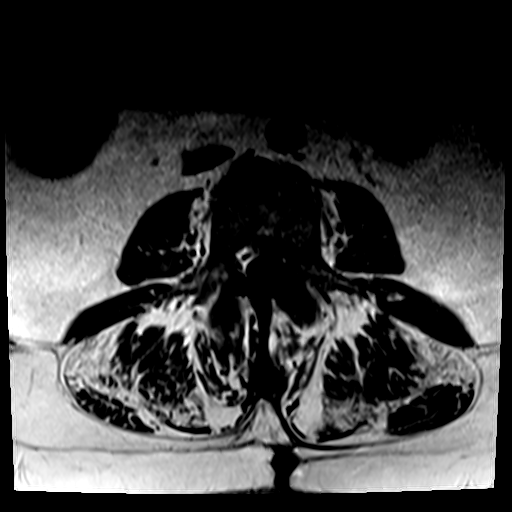
[im 30/45]
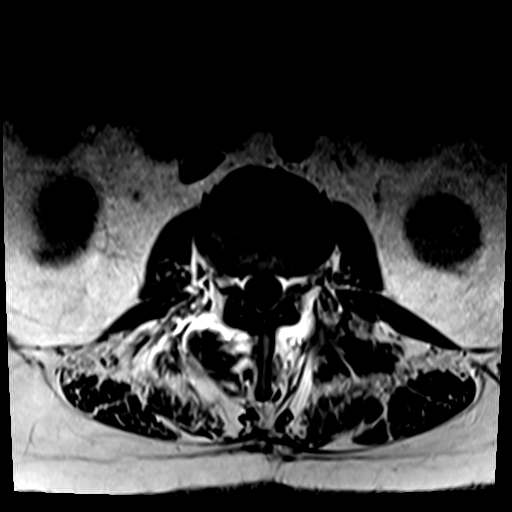
[im 40/45]
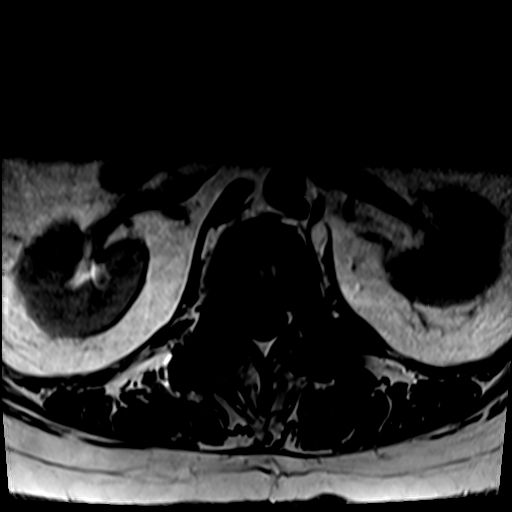

[Series 6: T2 · sagittal · 4.0mm · 0.81mm/px · 4 of 16 slices shown (2 of 2)]
[im 1/16]
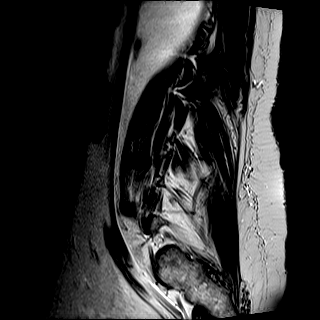
[im 6/16]
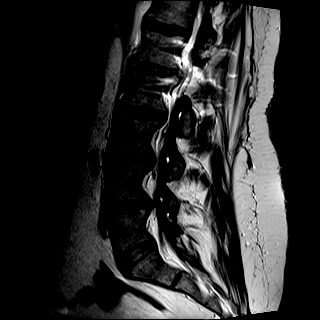
[im 11/16]
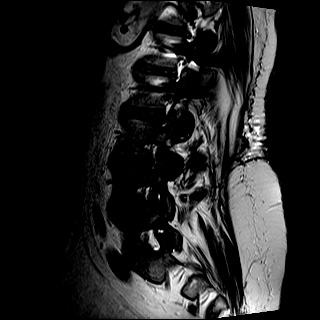
[im 16/16]
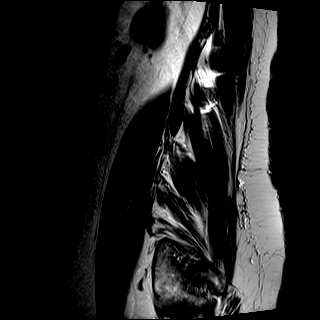

[22 of 48 positions shown; findings below may reference images not displayed]

FINDINGS: Segmentation: Standard. Lowest well-formed disc space labeled the
L5-S1 level.

Alignment: Trace retrolisthesis of L2 on L3 and L3 on L4, stable.
Alignment otherwise normal with preservation of the normal lumbar
lordosis.

Vertebrae: Susceptibility artifact related to prior PLIF at L1-2.
Vertebral body height maintained without acute or interval fracture.
Bone marrow signal intensity within normal limits. No worrisome
osseous lesions. No abnormal marrow edema or enhancement.

Conus medullaris and cauda equina: Conus extends to the T12-L1
level. Conus and cauda equina appear normal.

Paraspinal and other soft tissues: Chronic postoperative changes
present within the posterior paraspinous soft tissues. Paraspinous
soft tissues demonstrate no acute finding. Visualized visceral
structures unremarkable.

Disc levels:

T12-L1: Disc desiccation without significant disc bulge. Mild facet
hypertrophy. No canal or foraminal stenosis. Appearance is
relatively stable.

L1-2: Prior PLIF. Endplate osseous spurring mildly indents the right
ventral thecal sac without residual or recurrent stenosis. Foramina
remain patent. Appearance is stable.

L2-3: Disc desiccation with circumferential disc bulge. Moderate
right worse than left facet hypertrophy. Interval left hemi
laminectomy with micro discectomy. No significant residual or
recurrent disc herniation. Residual mild left greater than right
lateral recess stenosis. Central canal remains patent. Foramina
remain patent as well.

L3-4: Disc desiccation with circumferential disc bulge. Interval
left hemi laminectomy. Residual mild to moderate facet hypertrophy.
Improved left lateral recess stenosis from prior, with no more than
mild bilateral lateral recess narrowing now seen. Central canal
remains widely patent. Mild to moderate left with mild right L3
foraminal stenosis.

L4-5: Disc desiccation with mild disc bulge. Left extraforaminal
annular fissure. Moderate facet hypertrophy. Resultant mild
narrowing of the lateral recesses bilaterally. Central canal remains
patent. Mild bilateral L4 foraminal stenosis. Appearance is
relatively stable.

L5-S1: Negative interspace. Mild facet spurring. Epidural
lipomatosis. No stenosis.
IMPRESSION: 1. Interval left hemi laminectomy with micro discectomy at L2-3 and
L3-4 with improved left lateral recess stenosis. No residual or
recurrent disc herniation at these levels.
2. Degenerative disc bulge with facet hypertrophy at L4-5 with
resultant mild bilateral lateral recess and foraminal stenosis,
stable.
3. Stable postoperative changes from prior PLIF at L1-2 without
residual or recurrent stenosis.

## 2021-07-18 MED ORDER — GADOBUTROL 1 MMOL/ML IV SOLN
10.0000 mL | Freq: Once | INTRAVENOUS | Status: AC | PRN
  Administered 2021-07-18: 10 mL via INTRAVENOUS

## 2021-07-23 ENCOUNTER — Ambulatory Visit: Payer: Medicaid Other | Admitting: Specialist

## 2021-07-23 NOTE — Progress Notes (Signed)
Mild joint arthritis changes at two levels, no recurrent disc herniation.

## 2021-07-27 ENCOUNTER — Encounter: Payer: Self-pay | Admitting: Specialist

## 2021-07-27 ENCOUNTER — Other Ambulatory Visit: Payer: Self-pay

## 2021-07-27 ENCOUNTER — Ambulatory Visit (INDEPENDENT_AMBULATORY_CARE_PROVIDER_SITE_OTHER): Payer: Medicaid Other | Admitting: Specialist

## 2021-07-27 ENCOUNTER — Other Ambulatory Visit: Payer: Self-pay | Admitting: Specialist

## 2021-07-27 VITALS — BP 122/75 | HR 86 | Ht 66.0 in | Wt 226.0 lb

## 2021-07-27 DIAGNOSIS — Z9889 Other specified postprocedural states: Secondary | ICD-10-CM

## 2021-07-27 DIAGNOSIS — M47816 Spondylosis without myelopathy or radiculopathy, lumbar region: Secondary | ICD-10-CM | POA: Diagnosis not present

## 2021-07-27 MED ORDER — ETODOLAC 400 MG PO TABS: 400.0000 mg | ORAL_TABLET | Freq: Two times a day (BID) | ORAL | 1 refills | Status: DC

## 2021-07-27 NOTE — Progress Notes (Signed)
Office Visit Note   Patient: Tammy Li           Date of Birth: August 28, 1965           MRN: [id number] Visit Date: 07/27/2021              Requested by: No referring provider defined for this encounter. PCP: Pcp, No   Assessment & Plan: Visit Diagnoses:  1. Status post lumbar laminectomy   2. Spondylosis without myelopathy or radiculopathy, lumbar region     Plan: Avoid frequent bending and stooping  No lifting greater than 10 lbs. May use ice or moist heat for pain. Weight loss is of benefit. Best medication for lumbar disc disease is arthritis medications like motrin, celebrex and naprosyn. Exercise is important to improve your indurance and does allow people to function better inspite of back pain.  Referral to physical therapy Lodine 400 mg po BID with meal or snack Avoid frequent bending and stooping  No lifting greater than 10 lbs. May use ice or moist heat for pain. Weight loss is of benefit. Best medication for lumbar disc disease is arthritis medications like motrin, celebrex and naprosyn. Exercise is important to improve your indurance and does allow people to function better inspite of back pain.  Referral to physical therapy Lodine 400 mg po BID with meal or snack Talk to your pain management specialist before taking CBD because its use can cause urine drug screens to  be positive for cannabis.  Hemp CBD capsules, amazon.com 5,000-7,000 mg per bottle, 60 capsules per bottle, take one capsule twice a day. There are purified CBD tablets that do not have cannabis and these are available on line and by asking The store where it is bought. Follow-Up Instructions: Return in about 4 weeks (around 08/24/2021).   Orders:  No orders of the defined types were placed in this encounter.  No orders of the defined types were placed in this encounter.     Procedures: No procedures performed   Clinical Data: No additional findings.   Subjective: Chief Complaint   Patient presents with   Lower Back - Follow-up    55 year old female with bilateral lumbar pain from L1 to S1. She had pain such that she had difficulty even getting out of bed. She would just cry, lay back down and go back to sleep. Some intermittant incontinence with stress. Standing and walking increases the pain. She is loosing weight. Had 3 children in the past. Had surgery 12/202 lumbar laminectomy and fusion  L1-2.  Most all the time she walks in [place] but is concerned due to increased pain.   Review of Systems  Constitutional: Negative.   HENT: Negative.    Eyes: Negative.   Respiratory: Negative.    Cardiovascular: Negative.   Gastrointestinal: Negative.   Endocrine: Negative.   Genitourinary: Negative.   Musculoskeletal: Negative.   Skin: Negative.   Allergic/Immunologic: Negative.   Neurological: Negative.   Hematological: Negative.   Psychiatric/Behavioral: Negative.      Objective: Vital Signs: BP 122/75 (BP Location: Left Arm, Patient Position: Sitting)    Pulse 86    Ht 5\' 6"  (1.676 m)    Wt 226 lb (102.5 kg)    BMI 36.48 kg/m   Physical Exam Constitutional:      Appearance: She is well-developed.  HENT:     Head: Normocephalic and atraumatic.  Eyes:     Pupils: Pupils are equal, round, and reactive to light.  Pulmonary:  Effort: Pulmonary effort is normal.     Breath sounds: Normal breath sounds.  Abdominal:     General: Bowel sounds are normal.     Palpations: Abdomen is soft.  Musculoskeletal:        General: Normal range of motion.     Cervical back: Normal range of motion and neck supple.  Skin:    General: Skin is warm and dry.  Neurological:     Mental Status: She is alert and oriented to person, place, and time.  Psychiatric:        Behavior: Behavior normal.        Thought Content: Thought content normal.        Judgment: Judgment normal.    Ortho Exam  Specialty Comments:  No specialty comments available.  Imaging: No  results found.   PMFS History: Patient Active Problem List   Diagnosis Date Noted   HNP (herniated nucleus pulposus), cervical 12/08/2012    Priority: High    Class: Chronic   Cervical spondylosis without myelopathy 12/08/2012    Priority: High    Class: Chronic   Herniation of lumbar intervertebral disc with radiculopathy    Status post lumbar laminectomy 07/11/2020   Past Medical History:  Diagnosis Date   Arthritis    generalized   Cerebral palsy (HCC)    since birth   Claustrophobia    CP (cerebral palsy) (HCC)    Dyspnea    Family history of adverse reaction to anesthesia    daughter wakes up during anesthesia   GERD (gastroesophageal reflux disease)    Headache(784.0)    Neuromuscular disorder (HCC)    cp   Seasonal allergies     History reviewed. No pertinent family history.  Past Surgical History:  Procedure Laterality Date   BACK SURGERY     DIAGNOSTIC LAPAROSCOPY     surgery for endometriosis   HAND SURGERY Left    cyst   KNEE ARTHROSCOPY Right    LEG SURGERY Bilateral    heel cord lengthening  rt and lf plus 2 other    LUMBAR LAMINECTOMY/DECOMPRESSION MICRODISCECTOMY N/A 07/11/2020   Procedure: LEFT LUMBAR THREE THROUGH LUMBAR FOUR HEMILAMINECTOMY WITH LEFT LUMBAR TWO THROUGH LUMBAR THREE  AND LUMBAR THREE THROUGH LUMBAR FOUR  EXCISION OF EXTRUDED DISC HERNIATION;  Surgeon: [name], MD;  Location: [facility] OR;  Service: Orthopedics;  Laterality: N/A;   POSTERIOR CERVICAL FUSION/FORAMINOTOMY N/A 12/08/2012   Procedure: Left C3-4 and C6-7 foraminotomy with excision of HNP;  Surgeon: [name], MD;  Location: [facility] OR;  Service: Orthopedics;  Laterality: N/A;   TUBAL LIGATION     Social History   Occupational History   Not on file  Tobacco Use   Smoking status: Every Day    Packs/day: 0.25    Years: 20.00    Pack years: 5.00    Types: Cigarettes   Smokeless tobacco: Never   Tobacco comments:    stopped smoking at age 47  Vaping Use   Vaping Use:  Never used  Substance and Sexual Activity   Alcohol use: No   Drug use: Never    Types: IV   Sexual activity: Not on file

## 2021-07-27 NOTE — Patient Instructions (Addendum)
Avoid frequent bending and stooping  No lifting greater than 10 lbs. May use ice or moist heat for pain. Weight loss is of benefit. Best medication for lumbar disc disease is arthritis medications like motrin, celebrex and naprosyn. Exercise is important to improve your indurance and does allow people to function better inspite of back pain.  Referral to physical therapy Lodine 400 mg po BID with meal or snack Talk to your pain management specialist before taking CBD because its use can cause urine drug screens to  be positive for cannabis.  Hemp CBD capsules, amazon.com 5,000-7,000 mg per bottle, 60 capsules per bottle, take one capsule twice a day. If PT is not helpful then we may consider facet joint injections to help the pain.

## 2021-07-29 ENCOUNTER — Telehealth: Payer: Self-pay | Admitting: Specialist

## 2021-07-29 ENCOUNTER — Other Ambulatory Visit: Payer: Self-pay | Admitting: Specialist

## 2021-07-29 NOTE — Telephone Encounter (Signed)
[facility] called to inform Dr. [name] they can not service pt. Pt has medicaid they they don't accept medicaid. Phone number to Tammy Li is [phone].

## 2021-07-29 NOTE — Telephone Encounter (Signed)
Patient called advised the pharmacy need a prior auth before the Rx can be filled. ( Etodolac)  The number to contact patient is [phone]

## 2021-07-30 ENCOUNTER — Other Ambulatory Visit: Payer: Self-pay | Admitting: Radiology

## 2021-07-30 ENCOUNTER — Telehealth: Payer: Self-pay | Admitting: Specialist

## 2021-07-30 DIAGNOSIS — M47816 Spondylosis without myelopathy or radiculopathy, lumbar region: Secondary | ICD-10-CM

## 2021-07-30 DIAGNOSIS — Z9889 Other specified postprocedural states: Secondary | ICD-10-CM

## 2021-07-30 NOTE — Telephone Encounter (Signed)
Submitted on Cover my Meds--pending

## 2021-07-30 NOTE — Telephone Encounter (Signed)
I called and advised pt that her insurance denied her Etodolac. And I advised that Dr. [name] would look into getting her something different.

## 2021-07-30 NOTE — Telephone Encounter (Signed)
Pt called requesting a call back concerning her refill. Pt phone number is [phone].

## 2021-07-30 NOTE — Telephone Encounter (Signed)
I tried to call her to let er know that we changed her PT to [facility] in [facility] but her vm has not been setup, also I have submitted for prior auth for her medication and it is pending

## 2021-07-30 NOTE — Telephone Encounter (Signed)
Med was denied, pat is aware. I  also advised pt that [facility] does not take her insurance, so we referred to [facility] and advised they will call her to sched

## 2021-07-31 ENCOUNTER — Other Ambulatory Visit: Payer: Self-pay | Admitting: Specialist

## 2021-08-11 ENCOUNTER — Telehealth: Payer: Self-pay | Admitting: Specialist

## 2021-08-11 NOTE — Telephone Encounter (Signed)
Tammy Li from [facility] called. They did not approve the medication Etodlac. Need more info. Will need to submit a new prior auth. Call back number is [phone]. and expedite it.

## 2021-08-13 NOTE — Telephone Encounter (Signed)
I have submitted on COVER MY MEDS. KEY: [id number]--pending approval

## 2021-08-14 NOTE — Telephone Encounter (Signed)
This was denied again by the insurance.

## 2021-08-14 NOTE — Telephone Encounter (Signed)
Gave denial letter to Dr. [name] to see if this can be changed to another medication

## 2021-08-18 ENCOUNTER — Telehealth: Payer: Self-pay | Admitting: Specialist

## 2021-08-18 NOTE — Telephone Encounter (Signed)
Pt called requesting a refill of medication Dr. [name] prescribe for her arthritis. Please send to pharmacy on file. Please call pt about this matter at [phone].

## 2021-08-20 ENCOUNTER — Other Ambulatory Visit: Payer: Self-pay | Admitting: Specialist

## 2021-08-20 MED ORDER — SULINDAC 150 MG PO TABS: 150.0000 mg | ORAL_TABLET | Freq: Two times a day (BID) | ORAL | 3 refills | Status: AC

## 2021-08-24 ENCOUNTER — Ambulatory Visit: Payer: Medicaid Other | Attending: Specialist | Admitting: Physical Therapy

## 2021-08-26 ENCOUNTER — Ambulatory Visit: Payer: Medicaid Other

## 2021-08-27 ENCOUNTER — Ambulatory Visit: Payer: Medicaid Other | Admitting: Specialist

## 2021-08-31 ENCOUNTER — Encounter: Payer: Medicaid Other | Admitting: Physical Therapy

## 2021-09-02 ENCOUNTER — Ambulatory Visit: Payer: Medicaid Other | Admitting: Physical Therapy

## 2021-09-07 ENCOUNTER — Encounter: Payer: Medicaid Other | Admitting: Physical Therapy

## 2021-09-14 ENCOUNTER — Encounter: Payer: Medicaid Other | Admitting: Physical Therapy

## 2024-04-23 ENCOUNTER — Other Ambulatory Visit (INDEPENDENT_AMBULATORY_CARE_PROVIDER_SITE_OTHER): Payer: Self-pay

## 2024-04-23 ENCOUNTER — Ambulatory Visit (INDEPENDENT_AMBULATORY_CARE_PROVIDER_SITE_OTHER): Admitting: Orthopedic Surgery

## 2024-04-23 VITALS — BP 127/81 | HR 94 | Ht 66.0 in | Wt 226.0 lb

## 2024-04-23 DIAGNOSIS — M545 Low back pain, unspecified: Secondary | ICD-10-CM

## 2024-04-23 NOTE — Progress Notes (Signed)
 Orthopedic Spine Surgery Office Note  Assessment: Patient is a 58 y.o. female with unclear cause of her right hip pain since she has no hip pain on her exam and her lumbar MRI does not show any significant stenosis. Her back pain may come from facet arthropathy. She does increased T2 signal in her facet joints   Plan: -Explained that initially conservative treatment is tried as a significant number of patients may experience relief with these treatment modalities. Discussed that the conservative treatments include:  -activity modification  -physical therapy  -over the counter pain medications  -medrol  dosepak  -lumbar steroid injections -Patient has tried tylenol , intramuscular steroid injections  -Recommended diagnostic/therapeutic facet injections -Patient should return to office on an as needed basis   Patient expressed understanding of the plan and all questions were answered to the patient's satisfaction.   ___________________________________________________________________________   History:  Patient is a 58 y.o. female who presents today for lumbar spine. Patient has a history of multiple lumbar spine surgery and chronic low back pain. She has noticed pain in her right hip and lumbar spine for about the last 5 months which is an acute worsening of her chronic low back pain. There was no trauma or injury that preceded the onset of pain. She no pain radiating past the hip.    Weakness: Yes, left knee feels weaker. No other weakness noted  Symptoms of imbalance: Denies Paresthesias and numbness: Denies Bowel or bladder incontinence: Denies Saddle anesthesia: Denies  Treatments tried: tylenol , intramuscular steroid injections  Review of systems: Denies fevers and chills, night sweats, unexplained weight loss, history of cancer. Has had pain that wakes her at night  Past medical history: CP Claustrophobia GERD History of headaches Chronic left knee pain Chronic back  pain  Allergies: sulfa  Past surgical history:  L1/2 TLIF and PSIF Diagnostic laparoscopy C3/4 and C6/7 foraminotomies Left L3/4 microdiscectomy Knee arthroscopy Heel cord lengthening  Social history: Denies use of nicotine product (smoking, vaping, patches, smokeless) Alcohol use: denies Denies recreational drug use   Physical Exam:  BMI of 36.5  General: no acute distress, appears stated age Neurologic: alert, answering questions appropriately, following commands Respiratory: unlabored breathing on room air, symmetric chest rise Psychiatric: appropriate affect, normal cadence to speech   MSK (spine):  -Strength exam      Left  Right EHL    5/5  5/5 TA    5/5  5/5 GSC    5/5  5/5 Knee extension  5/5  5/5 Hip flexion   5/5  5/5  -Sensory exam    Sensation intact to light touch in L3-S1 nerve distributions of bilateral lower extremities  -Straight leg raise: Negative bilaterally -Clonus: no beats bilaterally  -Left hip exam: no pain through range of motion -Right hip exam: no pain through range of motion, negative stinchfield, negative FABER  Imaging: XRs of the lumbar spine from 04/23/2024 were independently reviewed and interpreted, showing interbody placed at L1/2.  Posterior instrumentation at L1 and L2.  No lucency seen around the interbody or the posterior instrumentation. No significant degenerative changes. No fracture or dislocation seen.   MRI of the lumbar spine (on CD) from 04/09/2024 was independently reviewed and interpreted, showing no central, lateral recess, or foraminal cyst.  There is an interbody device and posterior instrumentation at L1/2.  Instrumentation appears in appropriate position and there is no evidence of complication but MRI is not the best modality to evaluate.  Left-sided hemilaminotomy defects at L2/3 and L3/4.  Patient name: Tammy Li Patient MRN: [id number] Date of visit: 04/23/24

## 2024-05-09 NOTE — Therapy (Incomplete)
 OUTPATIENT PHYSICAL THERAPY THORACOLUMBAR EVALUATION   Patient Name: Tammy Li MRN: [id number] DOB:[birth year], 58 y.o., female Today's Date: 05/09/2024  END OF SESSION:   Past Medical History:  Diagnosis Date   Arthritis    generalized   Cerebral palsy (HCC)    since birth   Claustrophobia    CP (cerebral palsy) (HCC)    Dyspnea    Family history of adverse reaction to anesthesia    daughter wakes up during anesthesia   GERD (gastroesophageal reflux disease)    Headache(784.0)    Neuromuscular disorder (HCC)    cp   Seasonal allergies    Past Surgical History:  Procedure Laterality Date   BACK SURGERY     DIAGNOSTIC LAPAROSCOPY     surgery for endometriosis   HAND SURGERY Left    cyst   KNEE ARTHROSCOPY Right    LEG SURGERY Bilateral    heel cord lengthening  rt and lf plus 2 other    LUMBAR LAMINECTOMY/DECOMPRESSION MICRODISCECTOMY N/A 07/11/2020   Procedure: LEFT LUMBAR THREE THROUGH LUMBAR FOUR HEMILAMINECTOMY WITH LEFT LUMBAR TWO THROUGH LUMBAR THREE  AND LUMBAR THREE THROUGH LUMBAR FOUR  EXCISION OF EXTRUDED DISC HERNIATION;  Surgeon: Lucilla Lynwood BRAVO, MD;  Location: [facility] OR;  Service: Orthopedics;  Laterality: N/A;   POSTERIOR CERVICAL FUSION/FORAMINOTOMY N/A 12/08/2012   Procedure: Left C3-4 and C6-7 foraminotomy with excision of HNP;  Surgeon: Lynwood BRAVO Lucilla, MD;  Location: [facility] OR;  Service: Orthopedics;  Laterality: N/A;   TUBAL LIGATION     Patient Active Problem List   Diagnosis Date Noted   Herniation of lumbar intervertebral disc with radiculopathy    Status post lumbar laminectomy 07/11/2020   HNP (herniated nucleus pulposus), cervical 12/08/2012    Class: Chronic   Cervical spondylosis without myelopathy 12/08/2012    Class: Chronic    PCP: No PCP  REFERRING PROVIDER: Georgina Ozell LABOR, MD   REFERRING DIAG: M54.50 (ICD-10-CM) - Lumbar pain   THERAPY DIAG:  No diagnosis found.  RATIONALE FOR EVALUATION AND TREATMENT: Rehabilitation  ONSET DATE:  *** ~5 months  NEXT MD VISIT: *** 05/15/24 with Gardiner Masters, MD for L3/4 and L4/5 facet injections, possible RFA    SUBJECTIVE:                                                                                                                                                                                                         SUBJECTIVE STATEMENT: ***   ***Patient is a 58 y.o. female who presents today for lumbar spine. Patient has a history  of multiple lumbar spine surgery and chronic low back pain. She has noticed pain in her right hip and lumbar spine for about the last 5 months which is an acute worsening of her chronic low back pain. There was no trauma or injury that preceded the onset of pain. She no pain radiating past the hip. ***  PAIN: Are you having pain? {OPRCPAIN:27236}  PERTINENT HISTORY:  ***L3-4 hemilaminectomy with left L2-3 and L3-4 excision of extruded disc herniation 07/11/20, Posterior cervical fusion and Left C3-4 and C6-7 foraminotomy with excision of HNP 12/08/12, Cerebral palsy, GERD, arthritis, B heel cord lengthening, GERD, claustrophobia   PRECAUTIONS: {Therapy precautions:24002}  RED FLAGS: {PT Red Flags:29287}  WEIGHT BEARING RESTRICTIONS: {Yes ***/No:24003}  FALLS:  Has patient fallen in last 6 months? {fallsyesno:27318}  LIVING ENVIRONMENT: Lives with: {OPRC lives with:25569::lives with their family} Lives in: {Lives in:25570} Stairs: {opstairs:27293} Has following equipment at home: Walker - 4 wheeled, bed side commode, and toilet riser  OCCUPATION: ***  PLOF: {PLOF:24004}  PATIENT GOALS: ***   OBJECTIVE: (objective measures completed at initial evaluation unless otherwise dated)  DIAGNOSTIC FINDINGS:  ***04/23/24 - XR Lumbar Spine: XRs of the lumbar spine from 04/23/2024 were independently reviewed and interpreted by MD, showing interbody placed at L1/2. Posterior instrumentation at L1 and L2. No lucency seen around the interbody or the  posterior instrumentation. No significant degenerative changes. No fracture or dislocation seen.   07/18/21 - MRI LUMBAR SPINE WITHOUT AND WITH CONTRAST IMPRESSION: 1. Interval left hemi laminectomy with micro discectomy at L2-3 and L3-4 with improved left lateral recess stenosis. No residual or recurrent disc herniation at these levels. 2. Degenerative disc bulge with facet hypertrophy at L4-5 with resultant mild bilateral lateral recess and foraminal stenosis, stable. 3. Stable postoperative changes from prior PLIF at L1-2 without residual or recurrent stenosis.  PATIENT SURVEYS:  Modified Oswestry:  MODIFIED OSWESTRY DISABILITY SCALE  Date:  ***  Pain intensity {ODI 1:32962}  2. Personal care (washing, dressing, etc.) {ODI 2:32963}  3. Lifting {ODI 3:32964}  4. Walking {ODI 4:32965}  5. Sitting {ODI 5:32966}  6. Standing {ODI 6:32967}  7. Sleeping {ODI 7:32968}  8. Social Life {ODI 8:32969}  9. Traveling {ODI 9:32970}  10. Employment/ Homemaking {ODI 10:32971}  Total ***/50  % Disability    Interpretation of scores: Score Category Description  0-20% Minimal Disability The patient can cope with most living activities. Usually no treatment is indicated apart from advice on lifting, sitting and exercise  21-40% Moderate Disability The patient experiences more pain and difficulty with sitting, lifting and standing. Travel and social life are more difficult and they may be disabled from work. Personal care, sexual activity and sleeping are not grossly affected, and the patient can usually be managed by conservative means  41-60% Severe Disability Pain remains the main problem in this group, but activities of daily living are affected. These patients require a detailed investigation  61-80% Crippled Back pain impinges on all aspects of the patient's life. Positive intervention is required  81-100% Bed-bound  These patients are either bed-bound or exaggerating their symptoms  Bluford FORBES Zoe DELENA Karon DELENA, et al. Surgery versus conservative management of stable thoracolumbar fracture: the PRESTO feasibility RCT. [place] ([place]): [employer]; 2021 Nov. [facility] Technology Assessment, No. 25.62.) Appendix 3, Oswestry Disability Index category descriptors. Available from: [employer]el[employer]cz  Minimally Clinically Important Difference (MCID) = 12.8%  SCREENING FOR RED FLAGS: Bowel or bladder incontinence: {Bzd/Wn:695039105} Spinal tumors: {Yes/No:304960894} Cauda equina syndrome: {Yes/No:304960894} Compression  fracture: {Yes/No:304960894} Abdominal aneurysm: {Yes/No:304960894}  COGNITION:  Overall cognitive status: {cognition:24006}    SENSATION: {sensation:27233}  POSTURE:  {posture:25561}  PALPATION: ***  LUMBAR ROM:   {AROM/PROM:27142}  Eval  Flexion   Extension   Right lateral flexion   Left lateral flexion   Right rotation   Left rotation   (Blank rows = not tested)  MUSCLE LENGTH: Hamstrings: Right *** deg; Left *** deg Thomas test: Right *** deg; Left *** deg Hamstrings: *** ITB: *** Piriformis: *** Hip flexors: *** Quads: *** Heelcord: ***  LOWER EXTREMITY ROM:     {AROM/PROM:27142}  Right eval Left eval  Hip flexion    Hip extension    Hip abduction    Hip adduction    Hip internal rotation    Hip external rotation    Knee flexion    Knee extension    Ankle dorsiflexion    Ankle plantarflexion    Ankle inversion    Ankle eversion    (Blank rows = not tested)  LOWER EXTREMITY MMT:    MMT Right eval Left eval  Hip flexion    Hip extension    Hip abduction    Hip adduction    Hip internal rotation    Hip external rotation    Knee flexion    Knee extension    Ankle dorsiflexion    Ankle plantarflexion    Ankle inversion    Ankle eversion     (Blank rows = not tested)  LUMBAR SPECIAL TESTS:  {lumbar special test:25242}  FUNCTIONAL TESTS:  {Functional  tests:24029}  GAIT: Distance walked: *** Assistive device utilized: {Assistive devices:23999} Level of assistance: {Levels of assistance:24026} Gait pattern: {gait characteristics:25376} Comments: ***   TODAY'S TREATMENT:   ***   PATIENT EDUCATION:  Education details: {Education details:27468}  Person educated: {Person educated:25204} Education method: {Education Method [employer] Education comprehension: {Education Comprehension:25206}  HOME EXERCISE PROGRAM: ***   ASSESSMENT:  CLINICAL IMPRESSION: Emiliya Chretien is a 58 y.o. female who was referred to physical therapy for evaluation and treatment for ***.  ***   Patient reports onset of *** pain beginning ***. Pain is worse with ***.  Patient has deficits in *** ROM, *** LE flexibility, *** strength, abnormal posture, and TTP with abnormal muscle tension *** which are interfering with ADLs and are impacting quality of life.  On Modified Oswestry patient scored ***/50 demonstrating ***% or *** disability.  Janielle will benefit from skilled PT to address above deficits to improve mobility and activity tolerance with decreased pain interference.   ***  OBJECTIVE IMPAIRMENTS: {opptimpairments:25111}.   ACTIVITY LIMITATIONS: {activitylimitations:27494}  PARTICIPATION LIMITATIONS: {participationrestrictions:25113}  PERSONAL FACTORS: {Personal factors:25162} are also affecting patient's functional outcome.   REHAB POTENTIAL: {rehabpotential:25112}  CLINICAL DECISION MAKING: {clinical decision making:25114}  EVALUATION COMPLEXITY: {Evaluation complexity:25115}   GOALS: Goals reviewed with patient? {yes/no:20286}  SHORT TERM GOALS: Target date: ***  Patient will be independent with initial HEP to improve outcomes and carryover.  Baseline: *** Goal status: {GOALSTATUS:25110}  2.  Patient will report 25% improvement in low back pain to improve QOL. Baseline: *** Goal status: {GOALSTATUS:25110}  3.  *** Baseline:  Goal  status: {GOALSTATUS:25110}   LONG TERM GOALS: Target date: ***  Patient will be independent with ongoing/advanced HEP for self-management at home.  Baseline: *** Goal status: {GOALSTATUS:25110}  2.  Patient will report 50-75% improvement in low back pain to improve QOL.  Baseline: *** Goal status: {GOALSTATUS:25110}  3.  Patient to demonstrate ability to achieve and  maintain good spinal alignment/posturing and body mechanics needed for daily activities. Baseline: *** Goal status: {GOALSTATUS:25110}  4.  Patient will demonstrate full pain free lumbar ROM to perform ADLs.   Baseline: Refer to above lumbar ROM table Goal status: {GOALSTATUS:25110}  5.  Patient will demonstrate improved *** strength to >/= ***/5 for improved stability and ease of mobility. Baseline: Refer to above LE MMT table Goal status: {GOALSTATUS:25110}  6. Patient will report </= ***% on Modified Oswestry (MCID = 12%) to demonstrate improved functional ability with decreased pain interference. Baseline: *** Goal status: {GOALSTATUS:25110}  7.  Patient will tolerate *** min of (standing/sitting/walking) w/o increased pain to allow for *** improved mobility and activity tolerance. Baseline: *** Goal status: {GOALSTATUS:25110}  8.  Patient will report centralization of radicular symptoms.  Baseline: *** Goal status: {GOALSTATUS:25110}  9.  Patient will ***.  Baseline: *** Goal status: {GOALSTATUS:25110}   10.  *** Baseline: *** Goal status: {GOALSTATUS:25110}    PLAN:  PT FREQUENCY: {rehab frequency:25116}  PT DURATION: {rehab duration:25117}  PLANNED INTERVENTIONS: {rehab planned interventions:25118::97110-Therapeutic exercises,97530- Therapeutic [phone]- Neuromuscular re-education,97535- Self Rjmz,02859- Manual therapy,Patient/Family education}  PLAN FOR NEXT SESSION: ***   Elijah CHRISTELLA Hidden, PT 05/09/2024, 9:28 AM

## 2024-05-10 ENCOUNTER — Ambulatory Visit: Attending: Orthopedic Surgery | Admitting: Physical Therapy

## 2024-05-15 ENCOUNTER — Telehealth: Payer: Self-pay | Admitting: Physical Medicine and Rehabilitation

## 2024-05-15 ENCOUNTER — Ambulatory Visit: Admitting: Physical Medicine and Rehabilitation

## 2024-05-15 NOTE — Telephone Encounter (Signed)
 Pt called because they missed their 1:00 appt. Needs to reschedule.Pt call back number is [phone]

## 2024-06-05 ENCOUNTER — Telehealth: Payer: Self-pay | Admitting: Physical Medicine and Rehabilitation

## 2024-06-05 NOTE — Telephone Encounter (Signed)
 Patient advising she will keep her appointment tomorrow if she can get a ride she will call back in am.

## 2024-06-05 NOTE — Telephone Encounter (Signed)
 Patient called and need to be rescheduled. CB#[phone]

## 2024-06-06 ENCOUNTER — Encounter: Admitting: Physical Medicine and Rehabilitation

## 2024-06-06 ENCOUNTER — Telehealth: Payer: Self-pay | Admitting: Physical Medicine and Rehabilitation

## 2024-06-06 NOTE — Telephone Encounter (Signed)
 Patient called and said she didn't have the gas and could you reschedule her please. Thank you. CB#[phone]

## 2024-06-27 ENCOUNTER — Other Ambulatory Visit: Payer: Self-pay

## 2024-06-27 ENCOUNTER — Ambulatory Visit: Admitting: Physical Medicine and Rehabilitation

## 2024-06-27 VITALS — BP 135/80 | HR 73

## 2024-06-27 DIAGNOSIS — M47816 Spondylosis without myelopathy or radiculopathy, lumbar region: Secondary | ICD-10-CM | POA: Diagnosis not present

## 2024-06-27 MED ORDER — BUPIVACAINE HCL 0.5 % IJ SOLN
3.0000 mL | Freq: Once | INTRAMUSCULAR | Status: AC
  Administered 2024-06-27: 3 mL

## 2024-06-27 NOTE — Progress Notes (Signed)
 Pain Scale   Average Pain 8 Patient advised she has chronic back pain radiating to right side, pain is constant.          +Driver, -BT, -Dye Allergies.

## 2024-07-01 NOTE — Progress Notes (Signed)
 Tammy Li - 58 y.o. female MRN [id number]  Date of birth: 09-20-1965  Office Visit Note: Visit Date: 06/27/2024 PCP: Pcp, No Referred by: Georgina Ozell LABOR, MD  Subjective: Chief Complaint  Patient presents with   Lower Back - Pain   HPI:  Tammy Li is a 58 y.o. female who comes in today at the request of Dr. Ozell Georgina for planned Bilateral  L3-4 and L4-5 Lumbar facet/medial branch block with fluoroscopic guidance.  The patient has failed conservative care including home exercise, medications, time and activity modification.  This injection will be diagnostic and hopefully therapeutic.  Please see requesting physician notes for further details and justification.  Exam has shown concordant pain with facet joint loading.   ROS Otherwise per HPI.  Assessment & Plan: Visit Diagnoses:    ICD-10-CM   1. Spondylosis without myelopathy or radiculopathy, lumbar region  M47.816 XR C-ARM NO REPORT    Nerve Block    bupivacaine  (MARCAINE ) 0.5 % (with pres) injection 3 mL      Plan: No additional findings.   Meds & Orders:  Meds ordered this encounter  Medications   bupivacaine  (MARCAINE ) 0.5 % (with pres) injection 3 mL    Orders Placed This Encounter  Procedures   Nerve Block   XR C-ARM NO REPORT    Follow-up: Return for Review Pain Diary.   Procedures: No procedures performed  Lumbar Diagnostic Facet Joint Nerve Block with Fluoroscopic Guidance   Patient: Tammy Li      Date of Birth: Mar 06, 1966 MRN: [id number] PCP: Pcp, No      Visit Date: 06/27/2024   Universal Protocol:    Date/Time: 11/23/257:23 PM  Consent Given By: the patient  Position: PRONE  Additional Comments: Vital signs were monitored before and after the procedure. Patient was prepped and draped in the usual sterile fashion. The correct patient, procedure, and site was verified.   Injection Procedure Details:   Procedure diagnoses:  1. Spondylosis without myelopathy or radiculopathy,  lumbar region      Meds Administered:  Meds ordered this encounter  Medications   bupivacaine  (MARCAINE ) 0.5 % (with pres) injection 3 mL     Laterality: Bilateral  Location/Site: L3-L4, L2 and L3 medial branches and L4-L5, L3 and L4 medial branches  Needle: 5.0 in., 25 ga.  Short bevel or Quincke spinal needle  Needle Placement: Oblique pedical  Findings:   -Comments: There was excellent flow of contrast along the articular pillars without intravascular flow.  Procedure Details: The fluoroscope beam is vertically oriented in AP and then obliqued 15 to 20 degrees to the ipsilateral side of the desired nerve to achieve the "Scotty dog" appearance.  The skin over the target area of the junction of the superior articulating process and the transverse process (sacral ala if blocking the L5 dorsal rami) was locally anesthetized with a 1 ml volume of 1% Lidocaine  without Epinephrine .  The spinal needle was inserted and advanced in a trajectory view down to the target.   After contact with periosteum and negative aspirate for blood and CSF, correct placement without intravascular or epidural spread was confirmed by injecting 0.5 ml. of Isovue-250.  A spot radiograph was obtained of this image.    Next, a 0.5 ml. volume of the injectate described above was injected. The needle was then redirected to the other facet joint nerves mentioned above if needed.  Prior to the procedure, the patient was given a Pain Diary which was completed for baseline measurements.  After the procedure, the patient rated their pain every 30 minutes and will continue rating at this frequency for a total of 5 hours.  The patient has been asked to complete the Diary and return to us  by mail, fax or hand delivered as soon as possible.   Additional Comments:  The patient tolerated the procedure well Dressing: 2 x 2 sterile gauze and Band-Aid    Post-procedure details: Patient was observed during the  procedure. Post-procedure instructions were reviewed.  Patient left the clinic in stable condition.   Clinical History: MRI LUMBAR SPINE WITHOUT AND WITH CONTRAST   TECHNIQUE: Multiplanar and multiecho pulse sequences of the lumbar spine were obtained without and with intravenous contrast.   CONTRAST:  10mL GADAVIST  GADOBUTROL  1 MMOL/ML IV SOLN   COMPARISON:  Previous radiograph from 07/10/2021 and MRI from 06/03/2020.   FINDINGS: Segmentation: Standard. Lowest well-formed disc space labeled the L5-S1 level.   Alignment: Trace retrolisthesis of L2 on L3 and L3 on L4, stable. Alignment otherwise normal with preservation of the normal lumbar lordosis.   Vertebrae: Susceptibility artifact related to prior PLIF at L1-2. Vertebral body height maintained without acute or interval fracture. Bone marrow signal intensity within normal limits. No worrisome osseous lesions. No abnormal marrow edema or enhancement.   Conus medullaris and cauda equina: Conus extends to the T12-L1 level. Conus and cauda equina appear normal.   Paraspinal and other soft tissues: Chronic postoperative changes present within the posterior paraspinous soft tissues. Paraspinous soft tissues demonstrate no acute finding. Visualized visceral structures unremarkable.   Disc levels:   T12-L1: Disc desiccation without significant disc bulge. Mild facet hypertrophy. No canal or foraminal stenosis. Appearance is relatively stable.   L1-2: Prior PLIF. Endplate osseous spurring mildly indents the right ventral thecal sac without residual or recurrent stenosis. Foramina remain patent. Appearance is stable.   L2-3: Disc desiccation with circumferential disc bulge. Moderate right worse than left facet hypertrophy. Interval left hemi laminectomy with micro discectomy. No significant residual or recurrent disc herniation. Residual mild left greater than right lateral recess stenosis. Central canal remains patent.  Foramina remain patent as well.   L3-4: Disc desiccation with circumferential disc bulge. Interval left hemi laminectomy. Residual mild to moderate facet hypertrophy. Improved left lateral recess stenosis from prior, with no more than mild bilateral lateral recess narrowing now seen. Central canal remains widely patent. Mild to moderate left with mild right L3 foraminal stenosis.   L4-5: Disc desiccation with mild disc bulge. Left extraforaminal annular fissure. Moderate facet hypertrophy. Resultant mild narrowing of the lateral recesses bilaterally. Central canal remains patent. Mild bilateral L4 foraminal stenosis. Appearance is relatively stable.   L5-S1: Negative interspace. Mild facet spurring. Epidural lipomatosis. No stenosis.   IMPRESSION: 1. Interval left hemi laminectomy with micro discectomy at L2-3 and L3-4 with improved left lateral recess stenosis. No residual or recurrent disc herniation at these levels. 2. Degenerative disc bulge with facet hypertrophy at L4-5 with resultant mild bilateral lateral recess and foraminal stenosis, stable. 3. Stable postoperative changes from prior PLIF at L1-2 without residual or recurrent stenosis.     Electronically Signed   By: Morene Hoard M.D.   On: 07/20/2021     Objective:  VS:  HT:    WT:   BMI:     BP:135/80  HR:73bpm  TEMP: ( )  RESP:  Physical Exam Vitals and nursing note reviewed.  Constitutional:      General: She is not in acute distress.  Appearance: Normal appearance. She is not ill-appearing.  HENT:     Head: Normocephalic and atraumatic.     Right Ear: External ear normal.     Left Ear: External ear normal.  Eyes:     Extraocular Movements: Extraocular movements intact.  Cardiovascular:     Rate and Rhythm: Normal rate.     Pulses: Normal pulses.  Pulmonary:     Effort: Pulmonary effort is normal. No respiratory distress.  Abdominal:     General: There is no distension.      Palpations: Abdomen is soft.  Musculoskeletal:        General: Tenderness present.     Cervical back: Neck supple.     Right lower leg: No edema.     Left lower leg: No edema.     Comments: Patient has good distal strength with no pain over the greater trochanters.  No clonus or focal weakness.  Skin:    Findings: No erythema, lesion or rash.  Neurological:     General: No focal deficit present.     Mental Status: She is alert and oriented to person, place, and time.     Sensory: No sensory deficit.     Motor: No weakness or abnormal muscle tone.     Coordination: Coordination normal.  Psychiatric:        Mood and Affect: Mood normal.        Behavior: Behavior normal.      Imaging: No results found.

## 2024-07-01 NOTE — Procedures (Signed)
 Lumbar Diagnostic Facet Joint Nerve Block with Fluoroscopic Guidance   Patient: Tammy Li      Date of Birth: [birth year] MRN: [id number] PCP: Pcp, No      Visit Date: 06/27/2024   Universal Protocol:    Date/Time: 11/23/257:23 PM  Consent Given By: the patient  Position: PRONE  Additional Comments: Vital signs were monitored before and after the procedure. Patient was prepped and draped in the usual sterile fashion. The correct patient, procedure, and site was verified.   Injection Procedure Details:   Procedure diagnoses:  1. Spondylosis without myelopathy or radiculopathy, lumbar region      Meds Administered:  Meds ordered this encounter  Medications   bupivacaine  (MARCAINE ) 0.5 % (with pres) injection 3 mL     Laterality: Bilateral  Location/Site: L3-L4, L2 and L3 medial branches and L4-L5, L3 and L4 medial branches  Needle: 5.0 in., 25 ga.  Short bevel or Quincke spinal needle  Needle Placement: Oblique pedical  Findings:   -Comments: There was excellent flow of contrast along the articular pillars without intravascular flow.  Procedure Details: The fluoroscope beam is vertically oriented in AP and then obliqued 15 to 20 degrees to the ipsilateral side of the desired nerve to achieve the "Scotty dog" appearance.  The skin over the target area of the junction of the superior articulating process and the transverse process (sacral ala if blocking the L5 dorsal rami) was locally anesthetized with a 1 ml volume of 1% Lidocaine  without Epinephrine .  The spinal needle was inserted and advanced in a trajectory view down to the target.   After contact with periosteum and negative aspirate for blood and CSF, correct placement without intravascular or epidural spread was confirmed by injecting 0.5 ml. of Isovue-250.  A spot radiograph was obtained of this image.    Next, a 0.5 ml. volume of the injectate described above was injected. The needle was then redirected to  the other facet joint nerves mentioned above if needed.  Prior to the procedure, the patient was given a Pain Diary which was completed for baseline measurements.  After the procedure, the patient rated their pain every 30 minutes and will continue rating at this frequency for a total of 5 hours.  The patient has been asked to complete the Diary and return to us  by mail, fax or hand delivered as soon as possible.   Additional Comments:  The patient tolerated the procedure well Dressing: 2 x 2 sterile gauze and Band-Aid    Post-procedure details: Patient was observed during the procedure. Post-procedure instructions were reviewed.  Patient left the clinic in stable condition.
